# Patient Record
Sex: Male | Born: 1995
Health system: Southern US, Community
[De-identification: ages and names within clinical notes are randomized; demographics above are authoritative.]

## PROBLEM LIST (undated history)

## (undated) DIAGNOSIS — K900 Celiac disease: Secondary | ICD-10-CM

## (undated) DIAGNOSIS — I77810 Thoracic aortic ectasia: Secondary | ICD-10-CM

## (undated) HISTORY — DX: Thoracic aortic ectasia: I77.810

## (undated) HISTORY — PX: TONSILLECTOMY: SHX5217

## (undated) HISTORY — DX: Celiac disease: K90.0

---
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
## 2015-06-25 DIAGNOSIS — K900 Celiac disease: Secondary | ICD-10-CM | POA: Insufficient documentation

## 2015-07-01 ENCOUNTER — Encounter: Payer: Self-pay | Admitting: Family Medicine

## 2015-07-01 ENCOUNTER — Ambulatory Visit (INDEPENDENT_AMBULATORY_CARE_PROVIDER_SITE_OTHER): Payer: BLUE CROSS/BLUE SHIELD | Admitting: Family Medicine

## 2015-07-01 VITALS — BP 118/77 | HR 75 | Temp 97.8°F | Ht 73.0 in | Wt 206.0 lb

## 2015-07-01 DIAGNOSIS — Z8249 Family history of ischemic heart disease and other diseases of the circulatory system: Secondary | ICD-10-CM | POA: Diagnosis not present

## 2015-07-01 DIAGNOSIS — Z0000 Encounter for general adult medical examination without abnormal findings: Secondary | ICD-10-CM | POA: Diagnosis not present

## 2015-07-01 NOTE — Patient Instructions (Addendum)
Limit alcohol to 14 drinks per week, no more than 4 on any one occasion I have put in an order for an echocardiogram Flu shots yearly Return at your convenience for ADHD evaluation If you have not heard anything from my staff in a week about any orders/referrals/studies from today, please contact us here to follow-up [phone]  Health Maintenance - 21-21 Years [place] After high school, you may attend college or technical or vocational school, enroll in the [employer], or enter the workforce. PHYSICAL, SOCIAL, AND EMOTIONAL DEVELOPMENT  One hour of regular physical activity daily is recommended. Continue to participate in sports.  Develop your own interests and consider community service or volunteerism.  Make decisions about college and work plans.  Throughout these years, you should assume responsibility for your own health care. Increasing independence is important for you.  You may be exploring your sexual identity. Understand that you should never be in a situation that makes you feel uncomfortable, and tell your partner if you do not want to engage in sexual activity.  Body image may become important to you. Be mindful that eating disorders can develop at this time. Talk to your parents or other caregivers if you have concerns about body image, weight gain, or losing weight.  You may notice mood disturbances, depression, anxiety, attention problems, or trouble with alcohol. Talk to your health care provider if you have concerns about mental illness.  Set limits for yourself and talk with your parents or other caregivers about independent decision making.  Handle conflict without physical violence.  Avoid loud noises which may impair hearing.  Limit television and computer time to 2 hours each day. Individuals who engage in excessive inactivity are more likely to become overweight. RECOMMENDED IMMUNIZATIONS  Influenza vaccine.  All adults should be immunized  every year.  All adults, including pregnant women and people with hives-only allergy to eggs, can receive the inactivated influenza (IIV) vaccine.  Adults aged 18-49 years can receive the recombinant influenza (RIV) vaccine. The RIV vaccine does not contain any egg protein.  Tetanus, diphtheria, and acellular pertussis (Td, Tdap) vaccine.  Pregnant women should receive 1 dose of Tdap vaccine during each pregnancy. The dose should be obtained regardless of the length of time since the last dose. Immunization is preferred during the 27th to 36th week of gestation.  An adult who has not previously received Tdap or who does not know his or her vaccine status should receive 1 dose of Tdap. This initial dose should be followed by tetanus and diphtheria toxoids (Td) booster doses every 10 years.  Adults with an unknown or incomplete history of completing a 3-dose immunization series with Td-containing vaccines should begin or complete a primary immunization series including a Tdap dose.  Adults should receive a Td booster every 10 years.  Varicella vaccine.  An adult without evidence of immunity to varicella should receive 2 doses or a second dose if he or she has previously received 1 dose.  Pregnant females who do not have evidence of immunity should receive the first dose after pregnancy. This first dose should be obtained before leaving the health care facility. The second dose should be obtained 4-8 weeks after the first dose.  Human papillomavirus (HPV) vaccine.  Females aged 13-26 years who have not received the vaccine previously should obtain the 3-dose series.  The vaccine is not recommended for pregnant females. However, pregnancy testing is not needed before receiving a dose. If a male is found  to be pregnant after receiving a dose, no treatment is needed. In that case, the remaining doses should be delayed until after the pregnancy.  Males aged 57-21 years who have not received  the vaccine previously should receive the 3-dose series. Males aged 22-26 years may be immunized.  Immunization is recommended through the age of 72 years for any male who has sex with males and did not get any or all doses earlier.  Immunization is recommended for any person with an immunocompromised condition through the age of 27 years if he or she did not get any or all doses earlier.  During the 3-dose series, the second dose should be obtained 4-8 weeks after the first dose. The third dose should be obtained 24 weeks after the first dose and 16 weeks after the second dose.  Measles, mumps, and rubella (MMR) vaccine.  Adults born in [birth year] or later should have 1 or more doses of MMR vaccine unless there is a contraindication to the vaccine or there is laboratory evidence of immunity to each of the three diseases.  A routine second dose of MMR vaccine should be obtained at least 28 days after the first dose for students attending postsecondary schools, health care workers, and international travelers.  For females of childbearing age, rubella immunity should be determined. If there is no evidence of immunity, females who are not pregnant should be vaccinated. If there is no evidence of immunity, females who are pregnant should delay immunization until after pregnancy.  Pneumococcal 13-valent conjugate (PCV13) vaccine.  When indicated, a person who is uncertain of his or her immunization history and has no record of immunization should receive the PCV13 vaccine.  An adult aged 72 years or older who has certain medical conditions and has not been previously immunized should receive 1 dose of PCV13 vaccine. This PCV13 should be followed with a dose of pneumococcal polysaccharide (PPSV23) vaccine. The PPSV23 vaccine dose should be obtained at least 8 weeks after the dose of PCV13 vaccine.  An adult aged 63 years or older who has certain medical conditions and previously received 1 or more doses  of PPSV23 vaccine should receive 1 dose of PCV13. The PCV13 vaccine dose should be obtained 1 or more years after the last PPSV23 vaccine dose.  Pneumococcal polysaccharide (PPSV23) vaccine.  When PCV13 is also indicated, PCV13 should be obtained first.  An adult younger than age 66 years who has certain medical conditions should be immunized.  Any person who resides in a long-term care facility should be immunized.  An adult smoker should be immunized.  People with an immunocompromised condition and certain other conditions should receive both PCV13 and PPSV23 vaccines.  People with human immunodeficiency virus (HIV) infection should be immunized as soon as possible after diagnosis.  Immunization during chemotherapy or radiation therapy should be avoided.  Routine use of PPSV23 vaccine is not recommended for American Indians, [place] Natives, or people younger than 65 years unless there are medical conditions that require PPSV23 vaccine.  When indicated, people who have unknown immunization and have no record of immunization should receive PPSV23 vaccine.  One-time revaccination 5 years after the first dose of PPSV23 is recommended for people aged 19-64 years who have chronic kidney failure, nephrotic syndrome, asplenia, or immunocompromised conditions.  Meningococcal vaccine.  Adults with asplenia or persistent complement component deficiencies should receive 2 doses of quadrivalent meningococcal conjugate (MenACWY-D) vaccine. The doses should be obtained at least 2 months apart.  Microbiologists working with  certain meningococcal bacteria, [employer], people at risk during an outbreak, and people who travel to or live in countries with a high rate of meningitis should be immunized.  A first-year college student up through age 26 years who is living in a residence hall should receive a dose if he or she did not receive a dose on or after his or her 16th birthday.  Adults who  have certain high-risk conditions should receive one or more doses of vaccine.  Hepatitis A vaccine.  Adults who wish to be protected from this disease, have certain high-risk conditions, work with hepatitis A-infected animals, work in hepatitis A research labs, or travel to or work in countries with a high rate of hepatitis A should be immunized.  Adults who were previously unvaccinated and who anticipate close contact with an international adoptee during the first 60 days after arrival in the [place] from a country with a high rate of hepatitis A should be immunized.  Hepatitis B vaccine.  Adults who wish to be protected from this disease, have certain high-risk conditions, may be exposed to blood or other infectious body fluids, are household contacts or sex partners of hepatitis B positive people, are clients or workers in certain care facilities, or travel to or work in countries with a high rate of hepatitis B should be immunized.  Haemophilus influenzae type b (Hib) vaccine.  A previously unvaccinated person with asplenia or sickle cell disease or having a scheduled splenectomy should receive 1 dose of Hib vaccine.  Regardless of previous immunization, a recipient of a hematopoietic stem cell transplant should receive a 3-dose series 6-12 months after his or her successful transplant.  Hib vaccine is not recommended for adults with HIV infection. TESTING  Annual screening for vision and hearing problems is recommended. Vision should be screened at least once between 80-22 years of age.  You may be screened for anemia or tuberculosis.  You should have a blood test to check for high cholesterol.  You should be screened for alcohol and drug use.  If you are sexually active, you may be screened for sexually transmitted infections (STIs), pregnancy, or HIV. You should be screened for STIs if:  Your sexual activity has changed since the last screening test, and you are at an  increased risk for chlamydia or gonorrhea. Ask your health care provider if you are at risk.  If you are at an increased risk for hepatitis B, you should be screened for this virus. You are considered at high risk for hepatitis B if you:  Were born in a country where hepatitis B occurs often. Talk with your health care provider about which countries are considered high risk.  Have parents who were born in a high-risk country and have not received a shot to protect against hepatitis B (hepatitis B vaccine).  Have HIV or AIDS.  Use needles to inject street drugs.  Live with or have sex with someone who has hepatitis B.  Are a man who has sex with other men (MSM).  Get hemodialysis treatment.  Take certain medicines for conditions like cancer, organ transplantation, or autoimmune conditions. NUTRITION   You should:  Have three servings of low-fat milk and dairy products daily. If you do not drink milk or consume dairy products, you should eat calcium-enriched foods, such as juice, bread, or cereal. Dark, leafy greens or canned fish are alternate sources of calcium.  Drink plenty of water. Fruit juice should be limited to 8-12  oz (240-360 mL) each day. Sugary beverages and sodas should be avoided.  Avoid eating foods high in fat, salt, or sugar, such as chips, candy, and cookies.  Avoid fast foods and limit eating out at restaurants.  Try not to skip meals, especially breakfast. You should eat a variety of vegetables, fruits, and lean meats.  Eat meals together as a family whenever possible. ORAL HEALTH Brush your teeth twice a day and floss at least once a day. You should have two dental exams a year.  SKIN CARE You should wear sunscreen when out in the sun. TALK TO SOMEONE ABOUT:  Precautions against pregnancy, contraception, and sexually transmitted infections.  Taking a prescription medicine daily to prevent HIV infection if you are at risk of being infected with HIV. This  is called preexposure prophylaxis (PrEP). You are at risk if you:  Are a male who has sex with other males (MSM).  Are heterosexual and sexually active with more than one partner.  Take drugs by injection.  Are sexually active with a partner who has HIV.  Whether you are at high risk of being infected with HIV. If you choose to begin PrEP, you should first be tested for HIV. You should then be tested every 3 months for as long as you are taking PrEP.  Drug, tobacco, and alcohol use among your friends or at friends' homes. Smoking tobacco or marijuana and taking drugs have health consequences and may impact your brain development.  Appropriate use of over-the-counter or prescription medicines.  Driving guidelines and riding with friends.  The risks of drinking and driving or boating. Call someone if you have been drinking or using drugs and need a ride. WHAT'S NEXT? Visit your pediatrician or family physician once a year. By young adulthood, you should transition from your pediatrician to a family physician or internal medicine specialist. If you are a male and are sexually active, you may want to begin annual physical exams with a gynecologist. Document Released: 01/05/2007 Document Revised: 10/15/2013 Document Reviewed: 01/25/2007 [facility] Patient Information 2015 [place], [place]. This information is not intended to replace advice given to you by your health care provider. Make sure you discuss any questions you have with your health care provider. Celiac Disease Celiac disease is a digestive disease that causes your body's natural defense system (immune system) to react against its own cells. It interferes with taking in (absorbing) nutrients from food. Celiac disease is also known as celiac sprue, nontropical sprue, and gluten-sensitive enteropathy. People who have celiac disease cannot tolerate gluten. Gluten is a protein found in wheat, rye, and barley. With time, celiac disease will  damage the cells lining the small intestine. This leads to being unable to absorb nutrients from food (malabsorption), diarrhea, and nutritional problems. CAUSES  Celiac disease is genetic. This means you have a higher likelihood of getting the disease if someone in your family has or has had it. Up to 10% of your close relatives (parent, sibling, child) may also have the disease.  People with celiac disease tend to have other autoimmune diseases. The link may be genetic. These diseases include:  Dermatitis herpetiformis.  Thyroid disease.  Systemic lupus erythematosus.  Type 1 diabetes.  Liver disease.  Collagen vascular disease.  Rheumatoid arthritis.  Sjogren syndrome. SYMPTOMS  The symptoms of celiac disease vary from person to person. The symptoms are generally digestive or nutritional. Digestive symptoms include:  Recurring belly (abdominal) bloating and pain.  Gas.  Long-term (chronic) diarrhea.  Pale,  bad-smelling, greasy, or oily stool. Nutritional symptoms include:  Failure to thrive in infants.  Delayed growth in children.  Weight loss in children and adults.  Missed menstrual periods (often due to extreme weight loss).  Anemia.  Weakening bones (osteoporosis).  Fatigue and weakness.  Tingling or other signs of nerve damage (peripheral neuropathy).  Depression. DIAGNOSIS  If your symptoms and physical exam suggest that a digestive disorder or malnutrition is present, your caregiver may suspect celiac disease. You may have already begun a gluten-free diet. If symptoms persist, testing may be needed to confirm the diagnosis. Some tests are best done while you are on a normal, unrestricted diet. Tests may include:  Blood tests to check for nutritional deficiencies.  Blood tests to look for evidence that the body is producing antibodies against its own small intestine cells.  Taking a tissue sample (biopsy) from the small bowel for evaluation.  X-rays  of the small bowel.  Evaluating the stool for fat.  Tests to check for nutrient absorption from the intestine. TREATMENT  It is important to seek treatment. Untreated celiac disease can cause growth problems (in children), anemia, osteoporosis, and possible nerve problems. A pregnant patient with untreated celiac disease has a higher risk of miscarriage, and the fetus has an increased risk of low birth weight and other growth problems. If celiac disease is diagnosed in the early stages, treatment can allow you to live a long, nearly symptom-free life. Treatment includes following a gluten-free diet. This means avoiding all foods that contain gluten. Eating even a small amount of gluten can damage your intestine. For most people, following this diet will stop symptoms. It will heal existing intestinal damage and prevent further damage. Improvements begin within days of starting the diet. The small intestine is usually completely healed within 3 to 6 months, or it may take up to 2 years for older adults. A small percentage of people do not improve on the gluten-free diet. Depending on your age and the stage at which you were diagnosed, some problems such as delayed growth and discolored teeth may not improve. Sometimes, damaged intestines cannot heal. If your intestines are not absorbing enough nutrients, you may need to receive nutrition supplements through an intravenous (IV) tube. Drug treatments are being tested for unresponsive celiac disease. In this case, you may need to be evaluated for complications of the disease. Your caregiver may also recommend:  A pneumonia vaccination.  Nutrients and other treatments for any nutritional deficiencies. Your caregiver can provide you with more information on a gluten-free diet. Discussion with a dietitian skilled in this illness will be valuable. Support groups may also be helpful. HOME CARE INSTRUCTIONS   Focus on a gluten-free diet. This diet must become  a way of life.  Monitor your response to the gluten-free diet and treat any nutritional deficiencies.  Prepare ahead of time if you decide to eat outside the home.  Make and keep your regular follow-up visits with your caregiver.  Suggest to family members that they get screened for early signs of the disease. SEEK MEDICAL CARE IF:   You continue to have digestive symptoms (gas, cramping, diarrhea) despite a proper diet.  You have trouble sticking to the gluten-free diet.  You develop an itchy rash with groups of tiny blisters.  You develop severe weakness, balance problems, menstrual problems, or depression. Document Released: 10/10/2005 Document Revised: 02/24/2014 Document Reviewed: 01/27/2010 [facility] Patient Information 2015 [place], [place]. This information is not intended to replace advice given  to you by your health care provider. Make sure you discuss any questions you have with your health care provider.  

## 2015-07-01 NOTE — Assessment & Plan Note (Signed)
Encouraged healthy living; STD testing offered, declined by patient; recommended monthly self-testicular exams; flu shots recommended yearly; offered cholesterol and glucose testing, patient declined blood work today; see AVS for more information and guidance

## 2015-07-01 NOTE — Progress Notes (Signed)
Patient ID: Gavin Johnson, male   DOB: 08/07/1996, 19 y.o.   MRN: [id number]  Subjective:   Gavin Johnson is a 19 y.o. male here for a complete physical exam  Interim issues since last visit: father had aortic aneurysm repair; grandfather is still living, has aortic aneurysm Patient has had bronchitis four times and pneumonia one time; shots are UTD No chest pain with exercise; occasional shortness of breath with running, one out of every ten times has to stop exercising; goes out for a jog and more difficult to breathe than usual Little bit of swelling in the lower legs after running; goes away; plays soccer  Tobacco: quit after 3-4 months, then dipping, chewing tobacco; still uses occasionally Alcohol: more than guidelines Depression screen [name] 2/9 07/01/2015  Decreased Interest 0  Down, Depressed, Hopeless 0  PHQ - 2 Score 0  HTN: normal pressure today Obesity: n/a STDs: declined testing Colorectal cancer: grandfather died at age 81 from colon cancer; no blood in the stool Breast cancer: n/a Skin cancer: sunscreen occasionally  Past Medical History  Diagnosis Date  . Celiac disease    Past Surgical History  Procedure Laterality Date  . Tonsillectomy     Family History  Problem Relation Age of Onset  . Arthritis Mother   . Aortic aneurysm Father   . Cancer Maternal Grandfather     colon  . Diabetes Neg Hx    Social History  Substance Use Topics  . Smoking status: Never Smoker   . Smokeless tobacco: Current User    Types: Chew  . Alcohol Use: Yes     Comment: socially   Review of Systems  Constitutional: Negative for fever.  HENT: Negative for hearing loss.        Occasional ear infections  Eyes: Negative for visual disturbance.  Respiratory: Positive for shortness of breath (with exertion occasionally).   Cardiovascular: Negative for chest pain.  Gastrointestinal: Negative for abdominal pain.  Genitourinary: Negative for discharge.   Psychiatric/Behavioral: Negative for dysphoric mood.   Objective:   Filed Vitals:   07/01/15 1009  BP: 118/77  Pulse: 75  Temp: 97.8 F (36.6 C)  Height: 6\' 1"  (1.854 m)  Weight: 206 lb (93.441 kg)  SpO2: 96%   Body mass index is 27.18 kg/(m^2). Wt Readings from Last 3 Encounters:  07/01/15 206 lb (93.441 kg)  09/23/14 184 lb (83.462 kg)   Physical Exam  Constitutional: He appears well-developed and well-nourished. No distress.  HENT:  Head: Normocephalic and atraumatic.  Nose: Nose normal.  Mouth/Throat: Oropharynx is clear and moist.  Eyes: EOM are normal. No scleral icterus.  Neck: No JVD present. No thyromegaly present.  Cardiovascular: Normal rate, regular rhythm and normal heart sounds.   Pulmonary/Chest: Effort normal and breath sounds normal. No respiratory distress. He has no wheezes. He has no rales.  Abdominal: Soft. Bowel sounds are normal. He exhibits no distension. There is no tenderness. There is no guarding.  Musculoskeletal: Normal range of motion. He exhibits no edema.  Lymphadenopathy:    He has no cervical adenopathy.  Neurological: He is alert. He displays normal reflexes. He exhibits normal muscle tone. Coordination normal.  Skin: Skin is warm and dry. No rash noted. He is not diaphoretic. No erythema. No pallor.  Psychiatric: He has a normal mood and affect. His behavior is normal. Judgment and thought content normal.    Assessment/Plan:   Problem List Items Addressed This Visit      Other  Family hx of aortic aneurysm    With occasional DOE; order echocardiogram      Relevant Orders   Echocardiogram   Preventative health care - Primary    Encouraged healthy living; STD testing offered, declined by patient; recommended monthly self-testicular exams; flu shots recommended yearly; offered cholesterol and glucose testing, patient declined blood work today; see AVS for more information and guidance         Follow up plan: Return in about 1  year (around 06/30/2016) for next physical; return for ADHD evaluation when convenient.  An After Visit Summary was printed and given to the patient.

## 2015-07-01 NOTE — Assessment & Plan Note (Signed)
With occasional DOE; order echocardiogram

## 2015-09-09 ENCOUNTER — Ambulatory Visit
Admission: RE | Admit: 2015-09-09 | Discharge: 2015-09-09 | Disposition: A | Discharge status: Home / Self Care | Payer: BLUE CROSS/BLUE SHIELD | Source: Ambulatory Visit | Attending: Family Medicine | Admitting: Family Medicine

## 2015-09-09 DIAGNOSIS — Z8249 Family history of ischemic heart disease and other diseases of the circulatory system: Secondary | ICD-10-CM | POA: Diagnosis not present

## 2015-09-09 NOTE — Progress Notes (Signed)
*  PRELIMINARY RESULTS* Echocardiogram 2D Echocardiogram has been performed.  Gavin HousekeeperJerry R Johnson 09/09/2015, 10:38 AM

## 2016-02-20 ENCOUNTER — Ambulatory Visit
Admission: EM | Admit: 2016-02-20 | Discharge: 2016-02-20 | Disposition: A | Discharge status: Home / Self Care | Payer: BLUE CROSS/BLUE SHIELD | Attending: Family Medicine | Admitting: Family Medicine

## 2016-02-20 ENCOUNTER — Encounter: Payer: Self-pay | Admitting: Gynecology

## 2016-02-20 ENCOUNTER — Ambulatory Visit (INDEPENDENT_AMBULATORY_CARE_PROVIDER_SITE_OTHER): Payer: BLUE CROSS/BLUE SHIELD

## 2016-02-20 DIAGNOSIS — S6992XA Unspecified injury of left wrist, hand and finger(s), initial encounter: Secondary | ICD-10-CM

## 2016-02-20 DIAGNOSIS — S67193A Crushing injury of left middle finger, initial encounter: Secondary | ICD-10-CM | POA: Diagnosis not present

## 2016-02-20 MED ORDER — TETANUS-DIPHTH-ACELL PERTUSSIS 5-2.5-18.5 LF-MCG/0.5 IM SUSP
0.5000 mL | Freq: Once | INTRAMUSCULAR | Status: AC
  Administered 2016-02-20: 0.5 mL via INTRAMUSCULAR

## 2016-02-20 MED ORDER — SULFAMETHOXAZOLE-TRIMETHOPRIM 800-160 MG PO TABS: 1.0000 | ORAL_TABLET | Freq: Two times a day (BID) | ORAL | Status: DC

## 2016-02-20 NOTE — ED Notes (Signed)
Per patient moving furniture off truck x today when is left index finger caught between the door of the truck and the appliance.

## 2016-02-20 NOTE — ED Notes (Signed)
Bacitracin to tip of finger, bandaid applied, wrapped in gauze for padding and place in metal finger splint

## 2016-02-20 NOTE — ED Provider Notes (Signed)
CSN: [id number]     Arrival date & time 02/20/16  1344 History   First MD Initiated Contact with Patient 02/20/16 1529     Chief Complaint  Patient presents with  . Finger Injury   (Consider location/radiation/quality/duration/timing/severity/associated sxs/prior Treatment) HPI Comments: [age] yo male with a c/o left index finger injury while moving furniture today. States finger got caught between the door of truck and appliance.   The history is provided by the patient.    Past Medical History  Diagnosis Date  . Celiac disease    Past Surgical History  Procedure Laterality Date  . Tonsillectomy     Family History  Problem Relation Age of Onset  . Arthritis Mother   . Aortic aneurysm Father   . Cancer Maternal Grandfather     colon  . Diabetes Neg Hx    Social History  Substance Use Topics  . Smoking status: Never Smoker   . Smokeless tobacco: Current User    Types: Chew  . Alcohol Use: Yes     Comment: socially    Review of Systems  Allergies  Amoxil  Home Medications   Prior to Admission medications   Medication Sig Start Date End Date Taking? Authorizing Provider  sulfamethoxazole-trimethoprim (BACTRIM DS,SEPTRA DS) 800-160 MG tablet Take 1 tablet by mouth 2 (two) times daily. 02/20/16   Payton Mccallumrlando Roy Snuffer, MD   Meds Ordered and Administered this Visit   Medications  Tdap (BOOSTRIX) injection 0.5 mL (0.5 mLs Intramuscular Given 02/20/16 1443)    BP 119/69 mmHg  Pulse 69  Temp(Src) 98.6 F (37 C) (Oral)  Ht 6\' 2"  (1.88 m)  Wt 210 lb (95.255 kg)  BMI 26.95 kg/m2  SpO2 99% No data found.   Physical Exam  Constitutional: He appears well-developed and well-nourished. No distress.  Musculoskeletal:       Left hand: He exhibits tenderness (mild tenderness to palpation over the distal DIP left middle finger) and swelling (mild). He exhibits normal range of motion, no bony tenderness, normal two-point discrimination, normal capillary refill and no laceration  (very superficial mild laceration just underneath the nail of the left middle finger). Normal sensation noted. Normal strength noted.       Hands: Skin: He is not diaphoretic.  Nursing note and vitals reviewed.   ED Course  Procedures (including critical care time)  Labs Review Labs Reviewed - No data to display  Imaging Review No results found.   Visual Acuity Review  Right Eye Distance:   Left Eye Distance:   Bilateral Distance:    Right Eye Near:   Left Eye Near:    Bilateral Near:         MDM   1. Injury of left middle finger, initial encounter      1. x-ray results and diagnosis reviewed with patient; wound irrigated and cleaned; Tetanus vaccine given in clinic 2. rx as per orders above; reviewed possible side effects, interactions, risks and benefits  3. Recommend supportive treatment with otc analgesics prn 4. Monitor and follow-up prn if symptoms worsen or don't improve    Payton Mccallumrlando Lakisha Peyser, MD 02/29/16 2214

## 2016-04-12 DIAGNOSIS — H10021 Other mucopurulent conjunctivitis, right eye: Secondary | ICD-10-CM | POA: Diagnosis not present

## 2016-07-08 ENCOUNTER — Encounter: Payer: Self-pay | Admitting: Family Medicine

## 2016-07-08 ENCOUNTER — Ambulatory Visit (INDEPENDENT_AMBULATORY_CARE_PROVIDER_SITE_OTHER): Payer: BLUE CROSS/BLUE SHIELD | Admitting: Family Medicine

## 2016-07-08 ENCOUNTER — Telehealth: Payer: Self-pay

## 2016-07-08 VITALS — BP 120/80 | HR 88 | Temp 98.2°F | Wt 208.0 lb

## 2016-07-08 DIAGNOSIS — J069 Acute upper respiratory infection, unspecified: Secondary | ICD-10-CM

## 2016-07-08 DIAGNOSIS — J9801 Acute bronchospasm: Secondary | ICD-10-CM | POA: Diagnosis not present

## 2016-07-08 MED ORDER — ALBUTEROL SULFATE HFA 108 (90 BASE) MCG/ACT IN AERS: 2.0000 | INHALATION_SPRAY | RESPIRATORY_TRACT | 1 refills | Status: DC | PRN

## 2016-07-08 MED ORDER — DOXYCYCLINE HYCLATE 100 MG PO TABS: 100.0000 mg | ORAL_TABLET | Freq: Two times a day (BID) | ORAL | 0 refills | Status: AC

## 2016-07-08 MED ORDER — PREDNISONE 10 MG PO TABS: ORAL_TABLET | ORAL | 0 refills | Status: AC

## 2016-07-08 NOTE — Telephone Encounter (Signed)
Insurance Prefers [employer]. Please send New RX for [employer].

## 2016-07-08 NOTE — Patient Instructions (Addendum)
Out of work today, Sat, Sunday Pick up medicines Only take the antibiotics if absolutely needed Please do eat yogurt daily or take a probiotic daily for the next month or two We want to replace the healthy germs in the gut If you notice foul, watery diarrhea in the next two months, schedule an appointment RIGHT AWAY Try vitamin C (orange juice if not diabetic or vitamin C tablets) and drink green tea to help your immune system during your illness Get plenty of rest and hydration

## 2016-07-08 NOTE — Progress Notes (Signed)
BP 120/80   Pulse 88   Temp 98.2 F (36.8 C) (Oral)   Wt 208 lb (94.3 kg)   SpO2 96%   BMI 26.71 kg/m    Subjective:    Patient ID: Gavin Johnson, male    DOB: 04-02-1996, 20 y.o.   MRN: [id number]  HPI: Gavin Johnson is a 20 y.o. male  Chief Complaint  Patient presents with  . URI    cough, congestion, poss fever & runny nose   Cough, congestion, possible fever, runny nose; woke up last Thursday with terrible body aches; cough followed a few days later; ears feel like they are in a tunnel; right ear worse; not much in the nose, drainage down the throat; can breathe out of the nose; cheeks feel a little swollen; terrible cough; hx of bronchitis; has had to use an inhaler; asthma as a child He has been trying Dayquil; no rash; no travel Roommate has been sick, similar symptoms Did have some body aches Had flu shot six months Works lifting heavy things all day  [facility] 07/08/2016 07/01/2015  Decreased Interest 0 0  Down, Depressed, Hopeless 0 0  PHQ - 2 Score 0 0   Relevant past medical, surgical, family and social history reviewed Past Medical History:  Diagnosis Date  . Celiac disease   MD note: hx of asthma as a child  Past Surgical History:  Procedure Laterality Date  . TONSILLECTOMY     Social History  Substance Use Topics  . Smoking status: Never Smoker  . Smokeless tobacco: Current User    Types: Chew  . Alcohol use Yes     Comment: socially   Interim medical history since last visit reviewed. Allergies and medications reviewed  Review of Systems Per HPI unless specifically indicated above     Objective:    BP 120/80   Pulse 88   Temp 98.2 F (36.8 C) (Oral)   Wt 208 lb (94.3 kg)   SpO2 96%   BMI 26.71 kg/m   Wt Readings from Last 3 Encounters:  07/08/16 208 lb (94.3 kg)  02/20/16 210 lb (95.3 kg)  07/01/15 206 lb (93.4 kg)    Physical Exam  Constitutional: He appears well-developed and well-nourished. No distress.    HENT:  Head: Normocephalic and atraumatic.  Right Ear: External ear normal.  Left Ear: External ear normal.  Nose: Nose normal.  Mouth/Throat: Oropharynx is clear and moist. No oropharyngeal exudate.  Eyes: EOM are normal. Right eye exhibits no discharge. Left eye exhibits no discharge.  Cardiovascular: Normal rate and regular rhythm.   No extrasystoles are present.  Pulmonary/Chest: Effort normal and breath sounds normal. No respiratory distress. He has no decreased breath sounds. He has no wheezes. He has no rhonchi. He has no rales.  Listening over mainstem bronchus, some tightness with forced cough  Lymphadenopathy:    He has no cervical adenopathy.  Neurological: He is alert. He displays no tremor.  Skin: No rash noted. He is not diaphoretic.  Psychiatric: He has a normal mood and affect. His speech is normal and behavior is normal. Judgment and thought content normal. Cognition and memory are normal.    No results found for this or any previous visit.    Assessment & Plan:   Problem List Items Addressed This Visit    None    Visit Diagnoses    Upper respiratory infection    -  Primary   most likely viral process;  explained antibiotic Rx to fill over weekend ONLY if absolutely needed for secondary bact infection; risk of C diff discussed   Bronchospasm, acute       will use low dose prednisone and SABA PRN       Follow up plan: No Follow-up on file.  An after-visit summary was printed and given to the patient at check-out.  Please see the patient instructions which may contain other information and recommendations beyond what is mentioned above in the assessment and plan.  Meds ordered this encounter  Medications  . albuterol (PROVENTIL HFA;VENTOLIN HFA) 108 (90 Base) MCG/ACT inhaler    Sig: Inhale 2 puffs into the lungs every 4 (four) hours as needed for wheezing or shortness of breath.    Dispense:  1 Inhaler    Refill:  1  . predniSONE (DELTASONE) 10 MG tablet     Sig: Take 3 pills daily on Fri and Sat, then 2 pills daily on Sun and Mon, then 1 pill daily on Tues and Wed    Dispense:  12 tablet    Refill:  0  . doxycycline (VIBRA-TABS) 100 MG tablet    Sig: Take 1 tablet (100 mg total) by mouth 2 (two) times daily.    Dispense:  20 tablet    Refill:  0

## 2017-11-17 ENCOUNTER — Ambulatory Visit
Admission: RE | Admit: 2017-11-17 | Discharge: 2017-11-17 | Disposition: A | Discharge status: Home / Self Care | Payer: BLUE CROSS/BLUE SHIELD | Source: Ambulatory Visit | Attending: Family Medicine | Admitting: Family Medicine

## 2017-11-17 ENCOUNTER — Other Ambulatory Visit: Payer: Self-pay | Admitting: Family Medicine

## 2017-11-17 ENCOUNTER — Ambulatory Visit: Payer: BLUE CROSS/BLUE SHIELD | Admitting: Family Medicine

## 2017-11-17 ENCOUNTER — Encounter: Payer: Self-pay | Admitting: Family Medicine

## 2017-11-17 VITALS — BP 110/70 | HR 86 | Temp 98.2°F | Resp 18 | Ht 73.0 in | Wt 218.6 lb

## 2017-11-17 DIAGNOSIS — M4184 Other forms of scoliosis, thoracic region: Secondary | ICD-10-CM | POA: Insufficient documentation

## 2017-11-17 DIAGNOSIS — R05 Cough: Secondary | ICD-10-CM

## 2017-11-17 DIAGNOSIS — J189 Pneumonia, unspecified organism: Secondary | ICD-10-CM

## 2017-11-17 DIAGNOSIS — J9811 Atelectasis: Secondary | ICD-10-CM | POA: Insufficient documentation

## 2017-11-17 DIAGNOSIS — R059 Cough, unspecified: Secondary | ICD-10-CM

## 2017-11-17 DIAGNOSIS — J309 Allergic rhinitis, unspecified: Secondary | ICD-10-CM

## 2017-11-17 DIAGNOSIS — R0602 Shortness of breath: Secondary | ICD-10-CM

## 2017-11-17 MED ORDER — LORATADINE 10 MG PO TABS
10.0000 mg | ORAL_TABLET | Freq: Every day | ORAL | 11 refills | Status: DC
Start: 2017-11-17 — End: 2019-02-21

## 2017-11-17 MED ORDER — PROMETHAZINE-DM 6.25-15 MG/5ML PO SYRP: 5.0000 mL | ORAL_SOLUTION | Freq: Four times a day (QID) | ORAL | 0 refills | Status: DC | PRN

## 2017-11-17 MED ORDER — FLUTICASONE PROPIONATE 50 MCG/ACT NA SUSP: 2.0000 | Freq: Every day | NASAL | 6 refills | Status: DC

## 2017-11-17 MED ORDER — ALBUTEROL SULFATE HFA 108 (90 BASE) MCG/ACT IN AERS: 2.0000 | INHALATION_SPRAY | RESPIRATORY_TRACT | 1 refills | Status: DC | PRN

## 2017-11-17 MED ORDER — AZITHROMYCIN 250 MG PO TABS: ORAL_TABLET | ORAL | 0 refills | Status: DC

## 2017-11-17 NOTE — Patient Instructions (Signed)
Cool Mist Vaporizer A cool mist vaporizer is a device that releases a cool mist into the air. If you have a cough or a cold, using a vaporizer may help relieve your symptoms. The mist adds moisture to the air, which may help thin your mucus and make it less sticky. When your mucus is thin and less sticky, it easier for you to breathe and to cough up secretions. Do not use a vaporizer if you are allergic to mold. Follow these instructions at home:  Follow the instructions that come with the vaporizer.  Do not use anything other than distilled water in the vaporizer.  Do not run the vaporizer all of the time. Doing that can cause mold or bacteria to grow in the vaporizer.  Clean the vaporizer after each time that you use it.  Clean and dry the vaporizer well before storing it.  Stop using the vaporizer if your breathing symptoms get worse. This information is not intended to replace advice given to you by your health care provider. Make sure you discuss any questions you have with your health care provider. Document Released: 07/07/2004 Document Revised: 04/29/2016 Document Reviewed: 01/09/2016 Elsevier Interactive Patient Education  2018 Else[employer] Inc[employer]   Upper Respiratory Infection, Adult Most upper respiratory infections (URIs) are caused by a virus. A URI affects the nose, throat, and upper air passages. The most common type of URI is often called "the common cold." Follow these instructions at home:  Take medicines only as told by your doctor.  Gargle warm saltwater or take cough drops to comfort your throat as told by your doctor.  Use a warm mist humidifier or inhale steam from a shower to increase air moisture. This may make it easier to breathe.  Drink enough fluid to keep your pee (urine) clear or pale yellow.  Eat soups and other clear broths.  Have a healthy diet.  Rest as needed.  Go back to work when your fever is gone or your doctor says it is okay. ? You may need  to stay home longer to avoid giving your URI to others. ? You can also wear a face mask and wash your hands often to prevent spread of the virus.  Use your inhaler more if you have asthma.  Do not use any tobacco products, including cigarettes, chewing tobacco, or electronic cigarettes. If you need help quitting, ask your doctor. Contact a doctor if:  You are getting worse, not better.  Your symptoms are not helped by medicine.  You have chills.  You are getting more short of breath.  You have brown or red mucus.  You have yellow or brown discharge from your nose.  You have pain in your face, especially when you bend forward.  You have a fever.  You have puffy (swollen) neck glands.  You have pain while swallowing.  You have white areas in the back of your throat. Get help right away if:  You have very bad or constant: ? Headache. ? Ear pain. ? Pain in your forehead, behind your eyes, and over your cheekbones (sinus pain). ? Chest pain.  You have long-lasting (chronic) lung disease and any of the following: ? Wheezing. ? Long-lasting cough. ? Coughing up blood. ? A change in your usual mucus.  You have a stiff neck.  You have changes in your: ? Vision. ? Hearing. ? Thinking. ? Mood. This information is not intended to replace advice given to you by your health care provider. Make   sure you discuss any questions you have with your health care provider. Document Released: 03/28/2008 Document Revised: 06/12/2016 Document Reviewed: 01/15/2014 Elsevier Interactive Patient Education  2018 Elsevier Inc.   

## 2017-11-17 NOTE — Progress Notes (Signed)
Name: Gavin Johnson   MRN: [id number]    DOB: December 15, 1995   Date:11/17/2017       Progress Note  Subjective  Chief Complaint  Chief Complaint  Patient presents with  . Cough    post nasal drip, chest congestion, cough is non productive for 6 weeks    HPI  PT presents with concern for ongoing non-productive cough, post-nasal drip, nasal congestion, fatigue (taking naps most afternoons), some shortness of breath, some chest discomfort when coughing, headaches for about 4 weeks. Denies fevers/chills, NVD, abdominal pain, body aches, decreased appetite, rashes. Hx asthma - has been prescribed albuterol inhaler in the past, but uses very rarely - is out of this today, we will provide refill.  Patient Active Problem List   Diagnosis Date Noted  . Family hx of aortic aneurysm 07/01/2015  . Preventative health care 07/01/2015  . Celiac disease     Social History   Tobacco Use  . Smoking status: Never Smoker  . Smokeless tobacco: Current User    Types: Chew  Substance Use Topics  . Alcohol use: Yes    Comment: socially     Current Outpatient Medications:  .  albuterol (PROAIR HFA) 108 (90 Base) MCG/ACT inhaler, Inhale 2 puffs into the lungs every 4 (four) hours as needed for wheezing or shortness of breath. (Patient not taking: Reported on 11/17/2017), Disp: 1 Inhaler, Rfl: 1  Allergies  Allergen Reactions  . Acetaminophen Hives  . Amoxil [Amoxicillin] Rash    ROS Constitutional: Negative for fever or weight change.  Respiratory: See HPI Cardiovascular: Negative for chest pain or palpitations.  Gastrointestinal: Negative for abdominal pain, no bowel changes.  Musculoskeletal: Negative for gait problem or joint swelling.  Skin: Negative for rash.  Neurological: Negative for dizziness.  Positive for headaches. No other specific complaints in a complete review of systems (except as listed in HPI above).  Objective  Vitals:   11/17/17 0858  BP: 110/70  Pulse: 86  Resp:  18  Temp: 98.2 F (36.8 C)  TempSrc: Oral  SpO2: 95%  Weight: 218 lb 9.6 oz (99.2 kg)  Height: 6\' 1"  (1.854 m)   Body mass index is 28.84 kg/m.  Nursing Note and Vital Signs reviewed.  Physical Exam  Constitutional: Patient appears well-developed and well-nourished.  No distress.  HEENT: head atraumatic, normocephalic, pupils equal and reactive to light, EOM's intact, TM's without erythema or bulging, no maxillary or frontal sinus tenderness, neck supple with mild RIGHT sided submandibular lymphadenopathy, oropharynx pink and moist without exudate; cobblestoning present in OP. Cardiovascular: Normal rate, regular rhythm, S1/S2 present.  No murmur or rub heard. No BLE edema. Pulmonary/Chest: Effort normal and breath sounds clear. No respiratory distress or retractions. Psychiatric: Patient has a normal mood and affect. behavior is normal. Judgment and thought content normal.  No results found for this or any previous visit (from the past 72 hour(s)).  Assessment & Plan  1. Shortness of breath - albuterol (PROAIR HFA) 108 (90 Base) MCG/ACT inhaler; Inhale 2 puffs into the lungs every 4 (four) hours as needed for wheezing or shortness of breath.  Dispense: 1 Inhaler; Refill: 1 - DG Chest 2 View; Future - Advised if CXR is negative, we will proceed with laboratory testing.  2. Cough - promethazine-dextromethorphan (PROMETHAZINE-DM) 6.25-15 MG/5ML syrup; Take 5 mLs by mouth 4 (four) times daily as needed for cough.  Dispense: 180 mL; Refill: 0 - DG Chest 2 View; Future  3. Allergic rhinitis, unspecified seasonality, unspecified trigger -  fluticasone (FLONASE) 50 MCG/ACT nasal spray; Place 2 sprays into both nostrils daily.  Dispense: 16 g; Refill: 6 - loratadine (CLARITIN) 10 MG tablet; Take 1 tablet (10 mg total) by mouth daily.  Dispense: 30 tablet; Refill: 11  -Red flags and when to present for emergency care or RTC including fever >101.31F, chest pain, shortness of breath  unrelieved with albuterol inhaler and rest, new/worsening/un-resolving symptoms, reviewed with patient at time of visit. Follow up and care instructions discussed and provided in AVS.

## 2018-01-17 ENCOUNTER — Other Ambulatory Visit: Payer: Self-pay

## 2018-01-17 ENCOUNTER — Encounter: Payer: Self-pay | Admitting: Family Medicine

## 2018-01-17 ENCOUNTER — Telehealth: Payer: Self-pay | Admitting: Cardiovascular Disease

## 2018-01-17 ENCOUNTER — Ambulatory Visit (INDEPENDENT_AMBULATORY_CARE_PROVIDER_SITE_OTHER): Payer: BLUE CROSS/BLUE SHIELD

## 2018-01-17 ENCOUNTER — Encounter: Payer: Self-pay | Admitting: Physician Assistant

## 2018-01-17 ENCOUNTER — Telehealth: Payer: Self-pay | Admitting: Family Medicine

## 2018-01-17 ENCOUNTER — Other Ambulatory Visit: Payer: Self-pay | Admitting: Family Medicine

## 2018-01-17 ENCOUNTER — Ambulatory Visit: Payer: BLUE CROSS/BLUE SHIELD | Admitting: Physician Assistant

## 2018-01-17 VITALS — BP 124/80 | HR 67 | Ht 74.0 in | Wt 219.8 lb

## 2018-01-17 DIAGNOSIS — I77810 Thoracic aortic ectasia: Secondary | ICD-10-CM

## 2018-01-17 DIAGNOSIS — R0602 Shortness of breath: Secondary | ICD-10-CM

## 2018-01-17 DIAGNOSIS — R002 Palpitations: Secondary | ICD-10-CM

## 2018-01-17 DIAGNOSIS — R42 Dizziness and giddiness: Secondary | ICD-10-CM

## 2018-01-17 DIAGNOSIS — Z8249 Family history of ischemic heart disease and other diseases of the circulatory system: Secondary | ICD-10-CM

## 2018-01-17 HISTORY — DX: Thoracic aortic ectasia: I77.810

## 2018-01-17 NOTE — Telephone Encounter (Signed)
Patient has been scheduled today for his ECHO at 10am and a office visit with labs to follow at 11am at [facility].

## 2018-01-17 NOTE — Telephone Encounter (Signed)
Registered Zio monitor [phone]

## 2018-01-17 NOTE — Patient Instructions (Addendum)
Medication Instructions:  Your physician recommends that you continue on your current medications as directed. Please refer to the Current Medication list given to you today.   Labwork: CMET, Mag, CBC, TSH  Testing/Procedures: Your physician has recommended that you wear an event monitor. Event monitors are medical devices that record the heart's electrical activity. Doctors most often us these monitors to diagnose arrhythmias. Arrhythmias are problems with the speed or rhythm of the heartbeat. The monitor is a small, portable device. You can wear one while you do your normal daily activities. This is usually used to diagnose what is causing palpitations/syncope (passing out).  Zio monitor [id number]  Follow-Up: Your physician recommends that you schedule a follow-up appointment in: 6 weeks with Dr. Kirke CorinArida.    Any Other Special Instructions Will Be Listed Below (If Applicable).     If you need a refill on your cardiac medications before your next appointment, please call your pharmacy.

## 2018-01-17 NOTE — Assessment & Plan Note (Signed)
With palpitations and shortness of breath; refer to cardiologist ASAP; no exercise, no weight lifting until cleared by cardiologist

## 2018-01-17 NOTE — Telephone Encounter (Signed)
I talked to patient; echocardiogram just came to me from Nov 2016 They told him back in 2016 that he was at the upper limit of normal and might have an aneurysm later in life No follow-up since then I'll get him in to see someone this week He is having palpitations and some shortness of breath No exercise until seen and cleared by cardiologist No weight lifting, no basketball ------------------------------------------ Urgent referral put in Staff notified that I will speak to cardiologist to get him seen today or tomorrow if needed

## 2018-01-17 NOTE — Progress Notes (Signed)
Cardiology Office Note Date:  01/17/2018  Patient ID:  Gavin Johnson, DOB Apr 20, 1996, MRN [id number] PCP:  Kerman PasseyLada, Melinda P, MD  Card[facility]ologist[facility]  [facility]    Chief Complaint: SOB/palpitations/dizziness x 12 months  History of Present Illness: Gavin Johnson is a 22 y.o. male with history of possible mildly dilated aortic root with a mildly to moderately dilated ascending aorta and celiac disease who we were asked to urgently work in for SOB and palpitations at the request of Dr. Sherie DonLada.   Patient previously underwent echo in 08/2015 that demonstrated EF 60-65%, no RWMA, normal LV diastolic function, mildly dilated aortic root measuring 3.8 cm, mildly to moderately dilated ascending aorta estimated at 4 cm. He has not had any cardiac imaging since. There are no recent labs in Epic or Care Everywhere for review.   Patient was recently seen at his PCP's office in 10/2017 for 4-6 weeks of post nasal drip, chest congestion, and a cough. CXR showed mild left base atelectasis with mild thoracic spine scoliosis. He was treated with azithromycin, cough medication, albuterol, and Flonase. He last used the inhaler ~ 2 weeks prior. Patient's father has history of aortic aneurysm s/p surgical repair at age 24. His grandfather also had an aortic aneurysm that was monitored, though not intervened upon. Mother with arthritis. Sister in good health.   PCP called the patient this morning noting the echo she had ordered in 08/2015, as above, just showed up in her in basket prompting her to call him to check on him;. Patient reported at that time he had been noting SOB and dizziness intermittently with exertion as well as palpitations, leading to an urgent cardiology referral.   Patient comes in today noting a 12 month history of exertional SOB and dizziness when he is performing strenuous activities such as carrying cabinets up a flight of stairs. Never with any associated chest pain, diaphoresis, nausea,  vomiting, or syncope. Symptoms will improve with resting for 3-5 minutes. He has rarely noted near syncope with exertion, last occurring ~ 4 months prior. When at rest in the evenings, he will occasionally note palpitations that he can only describe as a "heart pounding" sensation. Never with associated SOB, dizziness, chest pain, diaphoresis, nausea, vomiting, presyncope, or syncope. He does not think much about these palpitations, goes about his regular activities and notes they have spontaneously resolved. He is unable to quantify these as he does not really pay atetntion. He last had an episode of palpitations 3 days prior.   He drinks 6, twelve ounce beers on a weekend. He denies ever smoking tobacco or using any illegal drugs. He drinks 60 mg of caffeine daily with [facility]. He gets 1.5 hours of strenuous activity in daily with his job. He does not go to the gym or play any sports, even for recreation. He occasionally goes fishing. No family history of sudden death. No family history of premature CAD.   Stat echo performed this morning with a preliminary review showing a stable aortic root measuring 3.8 cm, ascending aorta measuring 3.9 cm and a tricuspid aortic valve.    Past Medical History:  Diagnosis Date  . Celiac disease   . Dilated aortic root (HCC) 01/17/2018    Past Surgical History:  Procedure Laterality Date  . TONSILLECTOMY      Current Meds  Medication Sig  . albuterol (PROAIR HFA) 108 (90 Base) MCG/ACT inhaler Inhale 2 puffs into the lungs every 4 (four) hours  as needed for wheezing or shortness of breath.  . fluticasone (FLONASE) 50 MCG/ACT nasal spray Place 2 sprays into both nostrils daily. (Patient taking differently: Place 2 sprays into both nostrils daily as needed. )  . loratadine (CLARITIN) 10 MG tablet Take 1 tablet (10 mg total) by mouth daily. (Patient taking differently: Take 10 mg by mouth daily as needed. )    Allergies:   Acetaminophen and  Amoxil [amoxicillin]   Social History:  The patient  reports that he has never smoked. His smokeless tobacco use includes chew. He reports that he drinks alcohol. He reports that he does not use drugs.   Family History:  The patient's family history includes Aortic aneurysm in his father; Arthritis in his mother; Cancer in his maternal grandfather.  ROS:   Review of Systems  Constitutional: Negative for chills, diaphoresis, fever, malaise/fatigue and weight loss.  HENT: Negative for congestion.   Eyes: Negative for discharge and redness.  Respiratory: Positive for shortness of breath. Negative for cough, hemoptysis, sputum production and wheezing.   Cardiovascular: Positive for palpitations. Negative for chest pain, orthopnea, claudication, leg swelling and PND.  Gastrointestinal: Negative for abdominal pain, blood in stool, heartburn, melena, nausea and vomiting.  Genitourinary: Negative for hematuria.  Musculoskeletal: Negative for falls and myalgias.  Skin: Negative for rash.  Neurological: Positive for dizziness. Negative for tingling, tremors, sensory change, speech change, focal weakness, seizures, loss of consciousness, weakness and headaches.  Endo/Heme/Allergies: Does not bruise/bleed easily.  Psychiatric/Behavioral: Negative for substance abuse. The patient is not nervous/anxious.   All other systems reviewed and are negative.    PHYSICAL EXAM:  VS:  BP 124/80 (BP Location: Right Arm, Patient Position: Sitting, Cuff Size: Normal)   Pulse 67   Ht 6\' 2"  (1.88 m)   Wt 219 lb 12 oz (99.7 kg)   BMI 28.21 kg/m  BMI: Body mass index is 28.21 kg/m.   Right arm BP: 124/80 Left arm BP: 128/82   Physical Exam  Constitutional: He is oriented to person, place, and time. He appears well-developed and well-nourished.  HENT:  Head: Normocephalic and atraumatic.  Eyes: Right eye exhibits no discharge. Left eye exhibits no discharge.  Neck: Normal range of motion. No JVD present.    Cardiovascular: Normal rate, regular rhythm, S1 normal, S2 normal and normal heart sounds. Exam reveals no distant heart sounds, no friction rub, no midsystolic click and no opening snap.  No murmur heard. Pulses:      Posterior tibial pulses are 2+ on the right side, and 2+ on the left side.  Pulmonary/Chest: Effort normal and breath sounds normal. No respiratory distress. He has no decreased breath sounds. He has no wheezes. He has no rales. He exhibits no tenderness.  Abdominal: Soft. He exhibits no distension. There is no tenderness.  Musculoskeletal: He exhibits no edema.  Neurological: He is alert and oriented to person, place, and time.  Skin: Skin is warm and dry. No cyanosis. Nails show no clubbing.  Psychiatric: He has a normal mood and affect. His speech is normal and behavior is normal. Judgment and thought content normal.     EKG:  Was ordered and interpreted by me today. Shows NSR, 69 bpm, early repolarization, no acute st/t changes  Recent Labs: No results found for requested labs within last 8760 hours.  No results found for requested labs within last 8760 hours.   CrCl cannot be calculated (No order found.).   Wt Readings from Last 3 Encounters:  01/17/18  219 lb 12 oz (99.7 kg)  11/17/17 218 lb 9.6 oz (99.2 kg)  07/08/16 208 lb (94.3 kg)     Orthostatic Vital Signs: Lying: BP 122/76, HR 67 bpm Sitting: BP 130/82, HR 72 bpm Standing: BP 137/88, HR 73 bpm Standing x 3 minutes: BP 133/82, HR 72 bpm  Other studies reviewed: Additional studies/records reviewed today include: summarized above  ASSESSMENT AND PLAN:  1. Palpitations: Symptoms present for ~ 12 months, and improving. EKG and rhythm strip show sinus rhythm without ectopy. Check tsh, cmet, magnesium, cbc. Schedule Zio monitor. If the above cardiac workup is unrevealing, recommend he follow up with his PCP for non-cardiac etiology. Consider changing albuterol to Xopenex, defer this to his PCP as their  office has prescribed this. May want to decrease energy drink consumption.    2. Exertional dizziness and SOB: Noted for the past 12 months, intermittent and improving. Orthostatic vitals signs negative as above. Zio monitor as labs as above. Echo as below. Less likely ischemia given age and lack of risk factors.    3. Possible mildly dilated aortic root and ascending aorta: Vitals stable. This may be a normal variant for him given his height of 6'2". Echo performed today on preliminary review is essentially unchanged from the echo ordered by his PCP in 08/2015. With his father's and grandfather's history of aortic aneurysm, there certainly could be an underlying connective tissue disorder. This can be monitored with periodic echoes with Dr. Kirke Corin on an annual basis at this time. No indication for urgent intervention.   Disposition: F/u with Dr. Kirke Corin in 4-6 weeks, following Zio monitor. Follow up with PCP as directed by their office for non-cardiac issues.   Current medicines are reviewed at length with the patient today.  The patient did not have any concerns regarding medicines.  Signed, Eula Listen, PA-C 01/17/2018 11:30 AM     [facility] [place] [address] [place], [place] [address] [phone]

## 2018-01-18 LAB — CBC
Hematocrit: 48.8 % (ref 37.5–51.0)
Hemoglobin: 16.8 g/dL (ref 13.0–17.7)
MCH: 31.2 pg (ref 26.6–33.0)
MCHC: 34.4 g/dL (ref 31.5–35.7)
MCV: 91 fL (ref 79–97)
Platelets: 306 10*3/uL (ref 150–379)
RBC: 5.38 x10E6/uL (ref 4.14–5.80)
RDW: 12.8 % (ref 12.3–15.4)
WBC: 6 10*3/uL (ref 3.4–10.8)

## 2018-01-18 LAB — COMPREHENSIVE METABOLIC PANEL
ALK PHOS: 72 IU/L (ref 39–117)
ALT: 47 IU/L — ABNORMAL HIGH (ref 0–44)
AST: 32 IU/L (ref 0–40)
Albumin/Globulin Ratio: 2.1 (ref 1.2–2.2)
Albumin: 5 g/dL (ref 3.5–5.5)
BILIRUBIN TOTAL: 0.6 mg/dL (ref 0.0–1.2)
BUN / CREAT RATIO: 14 (ref 9–20)
BUN: 13 mg/dL (ref 6–20)
CHLORIDE: 99 mmol/L (ref 96–106)
CO2: 21 mmol/L (ref 20–29)
Calcium: 10.1 mg/dL (ref 8.7–10.2)
Creatinine, Ser: 0.92 mg/dL (ref 0.76–1.27)
GFR calc non Af Amer: 117 mL/min/{1.73_m2} (ref >59)
GFR, EST AFRICAN AMERICAN: 135 mL/min/{1.73_m2} (ref >59)
Globulin, Total: 2.4 g/dL (ref 1.5–4.5)
Glucose: 84 mg/dL (ref 65–99)
Potassium: 4.4 mmol/L (ref 3.5–5.2)
Sodium: 137 mmol/L (ref 134–144)
TOTAL PROTEIN: 7.4 g/dL (ref 6.0–8.5)

## 2018-01-18 LAB — TSH: TSH: 1.11 u[IU]/mL (ref 0.450–4.500)

## 2018-01-18 LAB — MAGNESIUM: Magnesium: 2.1 mg/dL (ref 1.6–2.3)

## 2018-02-12 ENCOUNTER — Telehealth: Payer: Self-pay | Admitting: Cardiovascular Disease

## 2018-02-12 NOTE — Telephone Encounter (Signed)
Patient calling back for results  Please call to discuss

## 2018-02-12 NOTE — Telephone Encounter (Signed)
See Long Term Monitor results note. Reviewed with patient.

## 2018-03-02 ENCOUNTER — Ambulatory Visit: Payer: BLUE CROSS/BLUE SHIELD | Admitting: Cardiovascular Disease

## 2019-01-09 ENCOUNTER — Ambulatory Visit (INDEPENDENT_AMBULATORY_CARE_PROVIDER_SITE_OTHER): Payer: BLUE CROSS/BLUE SHIELD | Admitting: Nurse Practitioner

## 2019-01-09 ENCOUNTER — Encounter: Payer: Self-pay | Admitting: Emergency Medicine

## 2019-01-09 ENCOUNTER — Other Ambulatory Visit: Payer: Self-pay

## 2019-01-09 ENCOUNTER — Encounter: Payer: Self-pay | Admitting: Nurse Practitioner

## 2019-01-09 VITALS — BP 124/80 | HR 90 | Temp 98.5°F | Resp 18 | Ht 74.0 in

## 2019-01-09 DIAGNOSIS — R197 Diarrhea, unspecified: Secondary | ICD-10-CM

## 2019-01-09 DIAGNOSIS — R52 Pain, unspecified: Secondary | ICD-10-CM

## 2019-01-09 DIAGNOSIS — K529 Noninfective gastroenteritis and colitis, unspecified: Secondary | ICD-10-CM | POA: Diagnosis not present

## 2019-01-09 DIAGNOSIS — F411 Generalized anxiety disorder: Secondary | ICD-10-CM

## 2019-01-09 DIAGNOSIS — R509 Fever, unspecified: Secondary | ICD-10-CM | POA: Diagnosis not present

## 2019-01-09 LAB — POCT INFLUENZA A/B
INFLUENZA B, POC: NEGATIVE
Influenza A, POC: NEGATIVE

## 2019-01-09 MED ORDER — BUSPIRONE HCL 7.5 MG PO TABS: 7.5000 mg | ORAL_TABLET | Freq: Two times a day (BID) | ORAL | 2 refills | Status: DC

## 2019-01-09 NOTE — Patient Instructions (Addendum)
- [email or web address], online counseling, [facility], - Cognitive behavioral therapy  - Take buspar twice a day for the next 6 weeks and follow up to let us know how it is working   Gastroenteritis (also known as stomach flu, although unrelated to influenza) is inflammation of the gastrointestinal tract, involving both the stomach and intestines. How did I get it? Gastroenteritis can have many causes, including viral or bacterial infections, medication reactions, food allergies, food/water poisoning or abuse of laxatives or alcohol. The duration and severity of the condition is relative to the illness. What are the symptoms? Symptoms can include fatigue, lack of appetite, abdominal growling and cramping, nausea, vomiting and/or diarrhea and are usually brief. Typically, no serious consequences occur and the condition resolves itself in a few days without medical treatment. How do I treat it? -Take nausea medicine as needed for nausea. If needed you can take loperamide (Imodium) for diarrhea, but avoid taking it for more than 2 days at a time as it can then lead to constipation.  -Drink fluids and get plenty of rest. Do not consume alcohol or caffeine. Avoid medications containing aspirin or ibuprofen, which may irritate your stomach, and do not take any medications by mouth unless directed by your medical care provider. 1. Drink clear liquids. Sip water/half-strength sports drinks or suck on ice chips. If you vomit using this treatment, do not take anything for 1 hour and start over again. 2. If you do not vomit fluids, you may progress to full-strength sports drinks; popsicles; clear broth; bouillon; decaf tea; clear apple juice; plain-flavored gelatin; and half-strength, clear, carbonated beverages without fizz (ginger ale, lemon-lime sodas, etc.). NOTE: To remove the fizz from soda, pour some into a glass and stir with a spoon. 3. As you become hungry, try moving to soft foods. Some  examples include: saltine crackers, dry white bread/toast, bananas, apple sauce, plain white rice, soft cereals prepared with water, plain noodles and broth soups. Do not use sauces or condiments, including butter. You may return to a normal diet as tolerated within 24 hours after recovery from vomiting. Recommended diets THINGS TO AVOID WHILE RECOVERING: . Alcohol  . Caffeine  . Dairy products  . Citrus products  . Fatty, greasy and/or fried foods  . Raw fruits and vegetables  . Aspirin  . Ibuprofen CLEAR LIQUID DIET: . Apple, grape or cranberry juice  . Kool-Aid  . Fruit punch  . Gatorade  . Ginger ale or 7UP  . Decaf tea  . Clear bouillon  . Jello  . Popsicles  . Fruit ice  . Salt FULL LIQUID DIET: . Cocoa  . Carbonated, decaf beverages  . Broth  . Strained, bland soups  . Cream of wheat or rice cereals  . Denzil Magnuson  . Vegetable juices  . Strained fruit juices or nectars  . Sherbets  . Honey  . Cinnamon  . Nutmeg  . Vanilla or other extracts CLEAR LIQUID DIET, PLUS: . Coffee  . White bread or toast  . Cooked or ready-to-eat cereal (no bran)  . Graham crackers  . Saltines  . Pasta or rice  . Soft, cooked vegetables  . Boiled or mashed potatoes  . Apple sauce  . Bananas or seedless melon  . Cooked or canned fruits  . Mild cheese or cottage cheese SOFT FULL LIQUID DIET, PLUS: . Soft-cooked, poached or hard-boiled or scrambled eggs  . Tender meat, fish or poultry  . Soft cake or cookies without nuts or raisins  .  Butter, cream or margarine  . Jelly  SEEK MEDICAL TREATMENT IF: . You are unable to keep fluids down.  . You see blood or mucus in your stool.  . You vomit black or dark red material.  . You have a fever of 101?F (38.33?C) or higher.  . You have localized and/or persistent abdominal pain.

## 2019-01-09 NOTE — Progress Notes (Signed)
Name: Gavin Johnson   MRN: [id number]    DOB: 1995-12-11   Date:01/09/2019       Progress Note  Subjective  Chief Complaint  Chief Complaint  Patient presents with  . Diarrhea  . Fever    99.5 last night    HPI  Patient endorses diarrhea, body aches and fatigue and low grade fever started 2-3 days ago. Patient states initially had 5-6 episodes, states has had 1 episode in the last 24 hours. Initially bristol scale 7 now bristol scale 5. Patient endorses some abdominal cramping that is relieved with bowel movement. States employer wants note to return to work. Denies nausea, vomiting, blood in stools.  Took ibuprofen for body aches and intermittent headaches.  Drinking plenty of water 6-7 ounces, one gatorade a day.  No gluten in the house, dad had similar symptoms that resolved.   Patient has had panic attacks, typically before bedtime, states usually has generalized anxiety for a period of time a 1-2 months a year since he was 68 and anxiety gets worse at night. States started in September and went away in December but was having nightly panic attacks during that time. He decided to cut out caffeine and alcohol with some relief states feels much better now but wondering what options are for the future.   GAD 7 : Generalized Anxiety Score 01/09/2019  Nervous, Anxious, on Edge 1  Control/stop worrying 1  Worry too much - different things 0  Trouble relaxing 1  Restless 1  Easily annoyed or irritable 2  Afraid - awful might happen 0  Total GAD 7 Score 6  Anxiety Difficulty Somewhat difficult     PHQ2/9: Depression screen [facility] 2/9 01/09/2019 11/17/2017 07/08/2016 07/01/2015  Decreased Interest 0 0 0 0  Down, Depressed, Hopeless 0 0 0 0  PHQ - 2 Score 0 0 0 0  Altered sleeping 0 - - -  Tired, decreased energy 0 - - -  Change in appetite 0 - - -  Feeling bad or failure about yourself  0 - - -  Trouble concentrating 0 - - -  Moving slowly or fidgety/restless 0 - - -  Suicidal  thoughts 0 - - -  PHQ-9 Score 0 - - -  Difficult doing work/chores Not difficult at all - - -    PHQ reviewed. Negative  Patient Active Problem List   Diagnosis Date Noted  . Dilated aortic root (HCC) 01/17/2018  . Family hx of aortic aneurysm 07/01/2015  . Preventative health care 07/01/2015  . Celiac disease     Past Medical History:  Diagnosis Date  . Celiac disease   . Dilated aortic root (HCC) 01/17/2018    Past Surgical History:  Procedure Laterality Date  . TONSILLECTOMY      Social History   Tobacco Use  . Smoking status: Never Smoker  . Smokeless tobacco: Current User    Types: Chew  Substance Use Topics  . Alcohol use: Yes    Comment: socially     Current Outpatient Medications:  .  albuterol (PROAIR HFA) 108 (90 Base) MCG/ACT inhaler, Inhale 2 puffs into the lungs every 4 (four) hours as needed for wheezing or shortness of breath. (Patient not taking: Reported on 01/09/2019), Disp: 1 Inhaler, Rfl: 1 .  fluticasone (FLONASE) 50 MCG/ACT nasal spray, Place 2 sprays into both nostrils daily. (Patient not taking: Reported on 01/09/2019), Disp: 16 g, Rfl: 6 .  loratadine (CLARITIN) 10 MG tablet, Take 1 tablet (10  mg total) by mouth daily. (Patient not taking: Reported on 01/09/2019), Disp: 30 tablet, Rfl: 11  Allergies  Allergen Reactions  . Acetaminophen Hives  . Amoxil [Amoxicillin] Rash    ROS    No other specific complaints in a complete review of systems (except as listed in HPI above).  Objective  Vitals:   01/09/19 1338  BP: 124/80  Pulse: 90  Resp: 18  Temp: 98.5 F (36.9 C)  TempSrc: Oral  SpO2: 99%  Height: 6\' 2"  (1.88 m)    Body mass index is 28.21 kg/m.  Nursing Note and Vital Signs reviewed.  Physical Exam Constitutional:      Appearance: Normal appearance. He is well-developed.  HENT:     Head: Normocephalic and atraumatic.     Right Ear: Hearing normal.     Left Ear: Hearing normal.  Eyes:     Conjunctiva/sclera:  Conjunctivae normal.  Cardiovascular:     Rate and Rhythm: Normal rate and regular rhythm.     Heart sounds: Normal heart sounds.  Pulmonary:     Effort: Pulmonary effort is normal.     Breath sounds: Normal breath sounds.  Abdominal:     Tenderness: There is no abdominal tenderness. There is no right CVA tenderness or left CVA tenderness.  Musculoskeletal: Normal range of motion.  Neurological:     Mental Status: He is alert and oriented to person, place, and time.  Psychiatric:        Speech: Speech normal.        Behavior: Behavior normal. Behavior is cooperative.        Thought Content: Thought content normal.        Judgment: Judgment normal.       No results found for this or any previous visit (from the past 48 hour(s)).  Assessment & Plan  1. Fever and chills - POCT Influenza A/B  2. Generalized body aches - POCT Influenza A/B  3. Diarrhea of presumed infectious origin - POCT Influenza A/B  4. Gastroenteritis Symptoms vastly improving, discussed hydration, slowly advance diet, if not resolved or worsening let us know.   5. GAD (generalized anxiety disorder) Discussed CBT and given resources patient plans on starting. Discussed medication options patient did not want to try SSRI's states he read into them and did not like altering brain chemistry. Will trial buspar and follow-up in 6 weeks.  - busPIRone (BUSPAR) 7.5 MG tablet; Take 1 tablet (7.5 mg total) by mouth 2 (two) times daily.  Dispense: 60 tablet; Refill: 2  Face-to-face time with patient was more than 25 minutes, >50% time spent counseling and coordination of care

## 2019-02-21 ENCOUNTER — Ambulatory Visit: Payer: BLUE CROSS/BLUE SHIELD | Admitting: Family Medicine

## 2019-02-21 ENCOUNTER — Encounter: Payer: Self-pay | Admitting: Nurse Practitioner

## 2019-02-21 ENCOUNTER — Ambulatory Visit (INDEPENDENT_AMBULATORY_CARE_PROVIDER_SITE_OTHER): Payer: BLUE CROSS/BLUE SHIELD | Admitting: Nurse Practitioner

## 2019-02-21 ENCOUNTER — Other Ambulatory Visit: Payer: Self-pay

## 2019-02-21 DIAGNOSIS — F411 Generalized anxiety disorder: Secondary | ICD-10-CM

## 2019-02-21 MED ORDER — BUSPIRONE HCL 7.5 MG PO TABS: 7.5000 mg | ORAL_TABLET | Freq: Two times a day (BID) | ORAL | 2 refills | Status: DC

## 2019-02-21 NOTE — Progress Notes (Signed)
Virtual Visit via Video Note  I connected with Gavin Johnson on 02/21/19 at 10:00 AM EDT by a video enabled telemedicine application and verified that I am speaking with the correct person using two identifiers.   Staff discussed the limitations of evaluation and management by telemedicine and the availability of in person appointments. The patient expressed understanding and agreed to proceed.  Patient location: home  My location: home office Other people present:  none HPI  Patient presents for follow-up on anxiety started on buspar 7.5mg  BID. Taking it as prescribed without missed doses. Seem to work great, positive effect. States anxiety isn't completely resolved but doing well. States he has had 2 episodes of dizziness upon initially starting it, last one was 3 weeks ago but this has subsided. States has had rare episodes of short-term memory issues- started a sentences and then forgot what he was going to say. Says this has been rare as well, attributes to medicine but would like to continue.  Marland Kitchen  GAD 7 : Generalized Anxiety Score 02/21/2019 01/09/2019  Nervous, Anxious, on Edge 1 1  Control/stop worrying 1 1  Worry too much - different things 0 0  Trouble relaxing 1 1  Restless 0 1  Easily annoyed or irritable 2 2  Afraid - awful might happen 0 0  Total GAD 7 Score 5 6  Anxiety Difficulty Not difficult at all Somewhat difficult     PHQ2/9: Depression screen [facility] 2/9 02/21/2019 01/09/2019 11/17/2017 07/08/2016 07/01/2015  Decreased Interest 0 0 0 0 0  Down, Depressed, Hopeless 0 0 0 0 0  PHQ - 2 Score 0 0 0 0 0  Altered sleeping 0 0 - - -  Tired, decreased energy 0 0 - - -  Change in appetite 0 0 - - -  Feeling bad or failure about yourself  0 0 - - -  Trouble concentrating 0 0 - - -  Moving slowly or fidgety/restless 0 0 - - -  Suicidal thoughts 0 0 - - -  PHQ-9 Score 0 0 - - -  Difficult doing work/chores Not difficult at all Not difficult at all - - -     PHQ reviewed.  Negative  Patient Active Problem List   Diagnosis Date Noted  . Dilated aortic root (HCC) 01/17/2018  . Family hx of aortic aneurysm 07/01/2015  . Preventative health care 07/01/2015  . Celiac disease     Past Medical History:  Diagnosis Date  . Celiac disease   . Dilated aortic root (HCC) 01/17/2018    Past Surgical History:  Procedure Laterality Date  . TONSILLECTOMY      Social History   Tobacco Use  . Smoking status: Never Smoker  . Smokeless tobacco: Current User    Types: Chew  Substance Use Topics  . Alcohol use: Yes    Comment: socially     Current Outpatient Medications:  .  busPIRone (BUSPAR) 7.5 MG tablet, Take 1 tablet (7.5 mg total) by mouth 2 (two) times daily., Disp: 60 tablet, Rfl: 2 .  albuterol (PROAIR HFA) 108 (90 Base) MCG/ACT inhaler, Inhale 2 puffs into the lungs every 4 (four) hours as needed for wheezing or shortness of breath. (Patient not taking: Reported on 01/09/2019), Disp: 1 Inhaler, Rfl: 1 .  fluticasone (FLONASE) 50 MCG/ACT nasal spray, Place 2 sprays into both nostrils daily. (Patient not taking: Reported on 01/09/2019), Disp: 16 g, Rfl: 6 .  loratadine (CLARITIN) 10 MG tablet, Take 1 tablet (10 mg total)  by mouth daily. (Patient not taking: Reported on 01/09/2019), Disp: 30 tablet, Rfl: 11  Allergies  Allergen Reactions  . Acetaminophen Hives  . Amoxil [Amoxicillin] Rash    ROS   No other specific complaints in a complete review of systems (except as listed in HPI above).  Objective  There were no vitals filed for this visit.   There is no height or weight on file to calculate BMI.  Nursing Note and Vital Signs reviewed.  Physical Exam  Constitutional: Patient appears well-developed and well-nourished. No distress.  HENT: Head: Normocephalic and atraumatic. Pulmonary/Chest: Effort normal  Musculoskeletal: Normal range of motion,  Neurological: he is alert and oriented  Skin: No rash noted. No erythema.  Psychiatric:  Patient has a normal mood and affect. behavior is normal. Judgment and thought content normal.    Assessment & Plan  1. GAD (generalized anxiety disorder) Much improved, recommend counseling additionally.  - busPIRone (BUSPAR) 7.5 MG tablet; Take 1 tablet (7.5 mg total) by mouth 2 (two) times daily.  Dispense: 180 tablet; Refill: 2   Follow Up Instructions:  6 months for physical   I discussed the assessment and treatment plan with the patient. The patient was provided an opportunity to ask questions and all were answered. The patient agreed with the plan and demonstrated an understanding of the instructions.   The patient was advised to call back or seek an in-person evaluation if the symptoms worsen or if the condition fails to improve as anticipated.  I provided 12 minutes of non-face-to-face time during this encounter.   Cheryle HorsfallElizabeth E Verneta Hamidi, NP

## 2019-03-09 ENCOUNTER — Other Ambulatory Visit: Payer: Self-pay | Admitting: Nurse Practitioner

## 2019-03-09 DIAGNOSIS — F411 Generalized anxiety disorder: Secondary | ICD-10-CM

## 2019-08-29 ENCOUNTER — Encounter: Payer: BLUE CROSS/BLUE SHIELD | Admitting: Family Medicine

## 2020-03-10 ENCOUNTER — Ambulatory Visit: Payer: Self-pay | Admitting: Family Medicine

## 2020-03-11 ENCOUNTER — Encounter: Payer: Self-pay | Admitting: Internal Medicine

## 2020-03-11 ENCOUNTER — Other Ambulatory Visit: Payer: Self-pay

## 2020-03-11 ENCOUNTER — Ambulatory Visit: Payer: BC Managed Care – PPO | Admitting: Internal Medicine

## 2020-03-11 VITALS — BP 124/88 | HR 78 | Temp 98.5°F | Resp 16 | Ht 74.0 in | Wt 226.4 lb

## 2020-03-11 DIAGNOSIS — M26629 Arthralgia of temporomandibular joint, unspecified side: Secondary | ICD-10-CM

## 2020-03-11 DIAGNOSIS — F411 Generalized anxiety disorder: Secondary | ICD-10-CM

## 2020-03-11 HISTORY — DX: Arthralgia of temporomandibular joint, unspecified side: M26.629

## 2020-03-11 MED ORDER — BUSPIRONE HCL 5 MG PO TABS: 5.0000 mg | ORAL_TABLET | Freq: Two times a day (BID) | ORAL | 1 refills | Status: DC

## 2020-03-11 NOTE — Patient Instructions (Signed)

## 2020-03-11 NOTE — Progress Notes (Signed)
Patient ID: Gavin Johnson, male    DOB: [birth year], 24 y.o.   MRN: [id number]  PCP: Jamelle Haring, MD  Chief Complaint  Patient presents with  . Ear Pain    onset a couple of months ago  . Jaw Pain    Subjective:   Gavin Johnson is a 24 y.o. male, presents to clinic with CC of the following:  Chief Complaint  Patient presents with  . Ear Pain    onset a couple of months ago  . Jaw Pain    HPI:  Patient is a 24 year old male who presents today with ear pain that has been present for a couple months, and also some jaw discomfort. He notes over the past couple months he has some pains intermittently that are felt sometimes in the right ear, sometimes beneath the ear and the jaw, sometimes feels it down the neck a little bit further, and sometimes even has some throat discomfort with this.  He felt like he pulled a muscle in the front of his neck with a strain previously before her symptoms started.  Sometimes it is worse with food and eating.  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  He notes the symptoms last for 30 minutes to 1 hour, and then go away, and he has tried ibuprofen at times without significant relief when he is having the symptoms.  Denies any fevers, no increased mucus, no cough, and is not having symptoms presently today.  Of note, he was started on BuSpar short time back as he was having anxiety and some panic episodes at night, having difficulty getting to sleep, and since starting the BuSpar, those symptoms have pretty much resolved.  He is on 7.5 mg of BuSpar, and with his new insurance, the pharmacist noted he should be on either 5 mg or 15 mg and it will save him significant money.  After discussion today, he wanted to try the 5 mg dose and see if his symptoms remained controlled, and I felt that was reasonable, and did change the prescription for  him.  Tobacco-never smoker  Patient Active Problem List   Diagnosis Date Noted  . Dilated aortic root (HCC) 01/17/2018  . Family hx of aortic aneurysm 07/01/2015  . Preventative health care 07/01/2015  . Celiac disease       Current Outpatient Medications:  .  busPIRone (BUSPAR) 7.5 MG tablet, TAKE ONE TABLET BY MOUTH TWICE DAILY, Disp: 180 tablet, Rfl: 3   Allergies  Allergen Reactions  . Amoxil [Amoxicillin] Rash     Past Surgical History:  Procedure Laterality Date  . TONSILLECTOMY       Family History  Problem Relation Age of Onset  . Arthritis Mother   . Aortic aneurysm Father   . Cancer Maternal Grandfather        colon  . Diabetes Neg Hx      Social History   Tobacco Use  . Smoking status: Never Smoker  . Smokeless tobacco: Current User    Types: Chew  Substance Use Topics  . Alcohol use: Yes    Comment: socially    With staff assistance, above reviewed with the patient today.  ROS: As per HPI, otherwise no specific complaints on a limited and focused system review   No results found for this or any previous visit (from the past 72 hour(s)).  PHQ2/9: Depression screen [facility] 2/9 03/11/2020 02/21/2019 01/09/2019 11/17/2017 07/08/2016  Decreased Interest 0 0 0 0 0  Down, Depressed, Hopeless 0 0 0 0 0  PHQ - 2 Score 0 0 0 0 0  Altered sleeping 0 0 0 - -  Tired, decreased energy 0 0 0 - -  Change in appetite 0 0 0 - -  Feeling bad or failure about yourself  0 0 0 - -  Trouble concentrating 0 0 0 - -  Moving slowly or fidgety/restless 0 0 0 - -  Suicidal thoughts 0 0 0 - -  PHQ-9 Score 0 0 0 - -  Difficult doing work/chores Not difficult at all Not difficult at all Not difficult at all - -   PHQ-2/9 Result is neg  Fall Risk: Fall Risk  03/11/2020 02/21/2019 01/09/2019 11/17/2017 07/08/2016  Falls in the past year? 0 0 0 No No  Number falls in past yr: 0 0 0 - -  Injury with Fall? 0 0 0 - -  Follow up - Falls evaluation completed Falls evaluation  completed - -      Objective:   Vitals:   03/11/20 1434  BP: 124/88  Pulse: 78  Resp: 16  Temp: 98.5 F (36.9 C)  TempSrc: Temporal  SpO2: 97%  Weight: 226 lb 6.4 oz (102.7 kg)  Height: 6\' 2"  (1.88 m)    Body mass index is 29.07 kg/m.  Physical Exam   NAD, masked, looks well, pleasant HEENT - [place]/AT, sclera anicteric, PERRL, EOMI, conj - non-inj'ed, TM's and canals clear, nontender with tugging on the lobe of the right ear, pharynx mildly erythematous with no exudates, no other lesions noted Neck - supple, no adenopathy, no TM, no rigidity, no pain with neck range of motion He was tender at the right TMJ joint with palpation, slightly increased with opening and closing his jaw, with no major disruption of the TMJ joint noted no swelling, no overlying erythema, not markedly tender in the right lower jaw region and extending down the neck anteriorly in the area he noted he sometimes feels discomfort as well. Car - RRR without m/g/r Pulm- RR and effort normal at rest, CTA without wheeze or rales Abd - soft, NT Neuro/psychiatric - affect was not flat, appropriate with conversation  Alert and oriented  Speech  normal     Assessment & Plan:  1. GAD (generalized anxiety disorder) As above noted, did refill the buspirone for him today at the 5 mg dose (reduced from the 7.5 mg dose) to take twice daily He noted he did not see counseling in the past and that the BuSpar was helpful, and assess his response to the above. - busPIRone (BUSPAR) 5 MG tablet; Take 1 tablet (5 mg total) by mouth 2 (two) times daily.  Dispense: 180 tablet; Refill: 1  2. TMJPDS (temporomandibular joint pain dysfunction syndrome) Educated on this entity, and do feel it is a likely source to his symptoms.  Noted a little confused with the throat soreness, as often that is not a big problem with TMJ dysfunction, and there is no evidence of infection today with no adenopathy, fever, or exudates of concern. He notes  he is a with some anxiety issues, and likely contributing as well. Recommended ice massage to the area, can use ibuprofen products with food as needed, and noted he can try a generic Aleve (naproxen) product in its place, taking 2 up to twice daily with food and assess response.  Relative jaw rest also emphasized Discussed possibly a bite splint over time may be needed, and also our dental colleagues help with that and may pursue if not improving or worsening with the above.  We will follow-up if not improving or more problematic over time, or if other symptoms are arising as well       Towanda Malkin, MD 03/11/20 2:51 PM

## 2020-03-30 ENCOUNTER — Ambulatory Visit: Payer: Self-pay | Admitting: Family Medicine

## 2020-06-11 ENCOUNTER — Other Ambulatory Visit: Payer: Self-pay | Admitting: Internal Medicine

## 2020-06-11 DIAGNOSIS — F411 Generalized anxiety disorder: Secondary | ICD-10-CM

## 2020-07-29 DIAGNOSIS — Z20822 Contact with and (suspected) exposure to covid-19: Secondary | ICD-10-CM | POA: Diagnosis not present

## 2020-07-29 DIAGNOSIS — Z0289 Encounter for other administrative examinations: Secondary | ICD-10-CM | POA: Diagnosis not present

## 2020-09-14 ENCOUNTER — Other Ambulatory Visit: Payer: Self-pay | Admitting: Internal Medicine

## 2020-09-14 ENCOUNTER — Telehealth: Payer: Self-pay | Admitting: Internal Medicine

## 2020-09-14 DIAGNOSIS — F411 Generalized anxiety disorder: Secondary | ICD-10-CM

## 2020-09-14 NOTE — Telephone Encounter (Signed)
Gavin Johnson routed conversation to [facility] Nurse Triage Pool 3 minutes ago (9:40 AM)  Gavin Johnson 3 minutes ago (9:40 AM)  CS   PT need a refill  busPIRone (BUSPAR) 5 MG tablet [[id number] [facility] - [place], [place] - [address]  [address] [place] [address]  Phone: [phone] Fax: [phone]         Documentation   [place], [place] [phone]  Gavin Johnson

## 2020-09-14 NOTE — Telephone Encounter (Signed)
PT need a refill  busPIRone (BUSPAR) 5 MG tablet [[id number] [facility] - [place], [place] - [address]  [place] [address]  Phone: [phone] Fax: [phone]

## 2020-09-14 NOTE — Telephone Encounter (Signed)
See 2nd telephone encounter for 09/14/20.

## 2020-11-25 ENCOUNTER — Encounter: Payer: Self-pay | Admitting: Family Medicine

## 2020-11-25 ENCOUNTER — Ambulatory Visit: Payer: BC Managed Care – PPO | Admitting: Family Medicine

## 2020-11-25 ENCOUNTER — Other Ambulatory Visit: Payer: Self-pay

## 2020-11-25 VITALS — BP 118/72 | HR 93 | Temp 98.4°F | Resp 18 | Ht 74.0 in | Wt 229.3 lb

## 2020-11-25 DIAGNOSIS — R002 Palpitations: Secondary | ICD-10-CM

## 2020-11-25 DIAGNOSIS — K900 Celiac disease: Secondary | ICD-10-CM | POA: Diagnosis not present

## 2020-11-25 DIAGNOSIS — F411 Generalized anxiety disorder: Secondary | ICD-10-CM | POA: Diagnosis not present

## 2020-11-25 DIAGNOSIS — Z8249 Family history of ischemic heart disease and other diseases of the circulatory system: Secondary | ICD-10-CM

## 2020-11-25 DIAGNOSIS — I77810 Thoracic aortic ectasia: Secondary | ICD-10-CM | POA: Diagnosis not present

## 2020-11-25 MED ORDER — BUSPIRONE HCL 10 MG PO TABS: 5.0000 mg | ORAL_TABLET | Freq: Two times a day (BID) | ORAL | 3 refills | Status: DC | PRN

## 2020-11-25 NOTE — Progress Notes (Signed)
Name: Gavin Johnson   MRN: [id number]    DOB: Feb 18, 1996   Date:11/25/2020       Progress Note  Chief Complaint  Patient presents with  . Medication Refill     Subjective:   Gavin Johnson is a 25 y.o. male, presents to clinic for med refill/f/up and to transition care to new PCP  Here for med refill on buspar- uses for anxiety/panic attacks He takes BuSpar 5 mg twice a day at 8 AM and 8 PM He reports previously being on SSRIs and benzos and he did not like either of them -does not want to be on SSRI Feels he is currently coping well with his symptoms They do tend to get worse at night when he lays down to bed -often has palpitations at that time of night he is not sure if it is " in his head" but he tends to worry about it sometimes it makes his symptoms worse.  Palpitations and panic attacks are both intermittent.    GAD-7 and PHQ-9 reviewed and noted below, mildly positive anxiety screening, negative depression screening patient denies any depressive symptoms or feeling down no history of suicidal thoughts Overall he is getting okay sleep No family history of anxiety in his parents but no other mood disorder substance abuse or past traumatic experiences that he speaks of today    He does have a history of cardiac work-up with echo and Holter monitor in the past Hx of palpitations usually at night Family hx of aortic aneurysm in father and grandfather  Past echoes were reviewed echo was done in 2017 and again in 2019 and he was lost to follow-up with Dr. Kirke Corin He was due to follow-up with him and review Holter monitor results Patient states that palpitations come and go, usually at bedtime, no change in frequency, duration, intensity He has noted some diffierent quality in the past couple months-in the past he is only felt a forceful pounding or rapid heart rate and more recently he has felt that he had a skipped beat and then following forceful beat.  He has no noted  correlation of onset of palpitations with any activity or with positional changes.  He has had a few isolated incidents of dizziness in the past month but he denies any associated near syncope, diaphoresis, chest pain, shortness of breath.  He moves and delivers cabinets full-time and does a lot of physical activity and strenuous work and he has not had any exertional symptoms   Anxiety disorder and history of panic attacks GAD 7 : Generalized Anxiety Score 11/25/2020 02/21/2019 01/09/2019  Nervous, Anxious, on Edge 1 1 1   Control/stop worrying 1 1 1   Worry too much - different things 1 0 0  Trouble relaxing 0 1 1  Restless 1 0 1  Easily annoyed or irritable 1 2 2   Afraid - awful might happen 1 0 0  Total GAD 7 Score 6 5 6   Anxiety Difficulty Not difficult at all Not difficult at all Somewhat difficult   Depression screen [facility] 2/9 11/25/2020 03/11/2020 02/21/2019  Decreased Interest 0 0 0  Down, Depressed, Hopeless 0 0 0  PHQ - 2 Score 0 0 0  Altered sleeping 2 0 0  Tired, decreased energy 0 0 0  Change in appetite 0 0 0  Feeling bad or failure about yourself  0 0 0  Trouble concentrating 1 0 0  Moving slowly or fidgety/restless 0 0 0  Suicidal thoughts  0 0 0  PHQ-9 Score 3 0 0  Difficult doing work/chores Not difficult at all Not difficult at all Not difficult at all   Patient has not talked with therapist feels that he is coping well with his symptoms he has not seen psychiatry in the past and declines any referrals at this time  Celiac disease-he states that he is never seen a GI specialist his father has celiac's and patient had similar symptoms which all resolved when he avoided gluten in his diet.  He denies abdominal pain, diarrhea, joint pain, rashes     Current Outpatient Medications:  .  busPIRone (BUSPAR) 5 MG tablet, Take 1 tablet by mouth 2 (two) times daily., Disp: , Rfl:   Patient Active Problem List   Diagnosis Date Noted  . TMJPDS (temporomandibular joint pain  dysfunction syndrome) 03/11/2020  . GAD (generalized anxiety disorder) 03/11/2020  . Dilated aortic root (HCC) 01/17/2018  . Family hx of aortic aneurysm 07/01/2015  . Preventative health care 07/01/2015  . Celiac disease     Past Surgical History:  Procedure Laterality Date  . TONSILLECTOMY      Family History  Problem Relation Age of Onset  . Arthritis Mother   . Aortic aneurysm Father   . Cancer Maternal Grandfather        colon  . Diabetes Neg Hx     Social History   Tobacco Use  . Smoking status: Never Smoker  . Smokeless tobacco: Current User    Types: Chew  Substance Use Topics  . Alcohol use: Yes    Comment: socially  . Drug use: No     Allergies  Allergen Reactions  . Amoxil [Amoxicillin] Rash    Health Maintenance  Topic Date Due  . Hepatitis C Screening  Never done  . COVID-19 Vaccine (1) Never done  . INFLUENZA VACCINE  01/21/2021 (Originally 05/24/2020)  . TETANUS/TDAP  02/19/2026  . HIV Screening  Completed    Chart Review Today: I personally reviewed active problem list, medication list, allergies, family history, social history, health maintenance, notes from last encounter, lab results, imaging with the patient/caregiver today.    Review of Systems  Constitutional: Negative.  Negative for activity change, appetite change, chills, diaphoresis, fatigue, fever and unexpected weight change.  HENT: Negative.   Eyes: Negative.   Respiratory: Negative.  Negative for apnea, choking, chest tightness, shortness of breath, wheezing and stridor.   Cardiovascular: Positive for palpitations. Negative for chest pain and leg swelling.  Gastrointestinal: Negative.  Negative for abdominal pain.  Endocrine: Negative.   Genitourinary: Negative.   Musculoskeletal: Negative.   Skin: Negative.   Allergic/Immunologic: Negative.   Neurological: Positive for dizziness. Negative for tremors, syncope, weakness, light-headedness, numbness and headaches.   Hematological: Negative.   Psychiatric/Behavioral: Negative.  Negative for agitation, behavioral problems, confusion, decreased concentration, dysphoric mood, self-injury, sleep disturbance and suicidal ideas.  All other systems reviewed and are negative.    Objective:   Vitals:   11/25/20 1000  BP: 118/72  Pulse: 93  Resp: 18  Temp: 98.4 F (36.9 C)  TempSrc: Oral  SpO2: 98%  Weight: 229 lb 4.8 oz (104 kg)  Height: 6\' 2"  (1.88 m)    Body mass index is 29.44 kg/m.  Physical Exam Vitals and nursing note reviewed.  Constitutional:      General: He is not in acute distress.    Appearance: Normal appearance. He is well-developed. He is not ill-appearing, toxic-appearing or diaphoretic.  Interventions: Face mask in place.  HENT:     Head: Normocephalic and atraumatic.     Jaw: No trismus.     Right Ear: External ear normal.     Left Ear: External ear normal.  Eyes:     General: Lids are normal. No scleral icterus.       Right eye: No discharge.        Left eye: No discharge.     Conjunctiva/sclera: Conjunctivae normal.  Neck:     Trachea: Trachea and phonation normal. No tracheal deviation.  Cardiovascular:     Rate and Rhythm: Normal rate and regular rhythm.     Pulses: Normal pulses.          Radial pulses are 2+ on the right side and 2+ on the left side.       Posterior tibial pulses are 2+ on the right side and 2+ on the left side.     Heart sounds: Normal heart sounds. No murmur heard. No friction rub. No gallop.   Pulmonary:     Effort: Pulmonary effort is normal. No respiratory distress.     Breath sounds: Normal breath sounds. No stridor. No wheezing, rhonchi or rales.  Abdominal:     General: Bowel sounds are normal. There is no distension.     Palpations: Abdomen is soft.  Musculoskeletal:     Right lower leg: No edema.     Left lower leg: No edema.  Skin:    General: Skin is warm and dry.     Coloration: Skin is not jaundiced.     Findings: No  rash.     Nails: There is no clubbing.  Neurological:     Mental Status: He is alert. Mental status is at baseline.     Cranial Nerves: No dysarthria or facial asymmetry.     Motor: No tremor or abnormal muscle tone.     Gait: Gait normal.  Psychiatric:        Attention and Perception: Attention normal.        Mood and Affect: Mood and affect normal.        Speech: Speech normal.        Behavior: Behavior normal. Behavior is cooperative.        Thought Content: Thought content normal.      Records reviewed - personally reviewed past ECHO 2016 and 2019, past EKG past holter monitor results, cardiology consults - spent more than 10 min on chart and record review   Assessment & Plan:     ICD-10-CM   1. GAD (generalized anxiety disorder)  F41.1 busPIRone (BUSPAR) 10 MG tablet   Well-controlled with BuSpar and coping skills, patient declines any referrals at this time is interested in possibly having a little higher dose available Can try 5-10 mg BID PRN - refills entered, consider sooner f/up if any worsening sx   2. Celiac disease  K90.0    Well-controlled with avoiding gluten in diet no abdominal pain diarrhea bloody stools or other systemic symptoms of disease Continue gluten free diet   3. Palpitations  R00.2    Past cardiac consult lost to follow-up in 2018 continued palpitations and sensation of skipped beats, reviewed cardiac procedures and testing in the past On Exam today HR RRR, no M, G, R Dizziness episodes few in the past couple months independent of palpitations Palpitations occur most often at night but he states that they come and go randomly He has had some skipped beats  and forceful contractions that did not have any associated concerning cardiac symptoms he has not had any exertional chest pain, near syncope, orthopnea, lower extremity edema, PND or diaphoresis  I reviewed cardiac notes and cardiac testing because he did not follow-up with Dr. Kirke Corin for his last  scheduled appointment I cannot tell if they want to do any routine follow-up for monitoring is dilated aorta with his family history  I do think with the change in his palpitations and with his history as noted above and below that he should at least reestablish care with cardiology and see what they say  The patient's blood pressure and other vital signs were stable today and he is very well-appearing  We discussed following up with cardiology which she is in agreement with     4. Dilated aortic root (HCC)  I77.810    Follow-up with cardiology for monitoring   5. Family hx of aortic aneurysm  Z82.49    The last echo showed aortic root was normal and aorta was mildly dilated, lost to follow-up  Results from 01/18/2018: EF normal with normal wall motion.  Stable aortic root and ascending aorta.  Continue with Zio monitor and follow up with Dr. Kirke Corin.  No follow-up appointment was completed after 7-day Holter monitor was completed -  Interpretation and result note from Nicolasa Ducking, NP: Creig Hines, NP  02/12/2018 11:31 AM EDT      Monitoring showed occasional isolated beats from the top or bottom portions of the heart. 2 short episodes of rapid heart rate - one stemming from the top portion of the heart (4 beats) and one from the bottom (6 beats). Based on diary, it does not appear that either were symptomatic. With normal heart squeezing function and nl electrolytes and TSH, these are mostly likely benign, though sometimes we use a low dose of a medicine to slow the heart beat, especially if someone is symptomatic. Patient activations were linked to sinus rhythm/sinus tachycardia w/o any significant arrhythmias, which is reassuring. Follow up with Dr. Kirke Corin in May as planned.   We discussed medication, patient has never been on any beta-blockers before  Patient was scheduled for follow-up with Dr. Kirke Corin Mar 02, 2018 and encounter never occurred was completed  Pt  does not drink caffeine, thyroid has been screened in the past, he does have some nicotine use explained that that is a stimulant and will increase heart rate palpitations, blood pressure and to try and avoid  Patient does express that he has a lot of anxiety about his physical symptoms and then also tells himself that this might just be his anxiety I explained that repeated cardiac work-ups to reassure him of his heart health is usually common in order to rule out cardiac pathology and reassure that these are somatic symptoms secondary to anxiety or panic disorders - very common and very difficult to tell the difference.  At this time he does not have any symptoms concerning enough to get a stat referral or to send him to the hospital but continue monitoring and in the next few months get reestablished with cardiology  Encouraged him to return for a annual physical to do other screenings and health assessment and to recheck his symptoms and med changes as noted above   Danelle Berry, PA-C 11/25/20 10:31 AM

## 2020-12-03 ENCOUNTER — Other Ambulatory Visit
Admission: RE | Admit: 2020-12-03 | Discharge: 2020-12-03 | Disposition: A | Discharge status: Home / Self Care | Payer: BC Managed Care – PPO | Source: Ambulatory Visit | Attending: Cardiovascular Disease | Admitting: Cardiovascular Disease

## 2020-12-03 ENCOUNTER — Encounter: Payer: Self-pay | Admitting: Cardiovascular Disease

## 2020-12-03 ENCOUNTER — Ambulatory Visit (INDEPENDENT_AMBULATORY_CARE_PROVIDER_SITE_OTHER): Payer: BC Managed Care – PPO | Admitting: Cardiovascular Disease

## 2020-12-03 ENCOUNTER — Other Ambulatory Visit: Payer: Self-pay

## 2020-12-03 VITALS — BP 130/94 | HR 84 | Ht 74.0 in | Wt 227.0 lb

## 2020-12-03 DIAGNOSIS — I77810 Thoracic aortic ectasia: Secondary | ICD-10-CM | POA: Diagnosis not present

## 2020-12-03 DIAGNOSIS — R002 Palpitations: Secondary | ICD-10-CM

## 2020-12-03 LAB — BASIC METABOLIC PANEL
Anion gap: 14 (ref 5–15)
BUN: 19 mg/dL (ref 6–20)
CO2: 20 mmol/L — ABNORMAL LOW (ref 22–32)
Calcium: 9.4 mg/dL (ref 8.9–10.3)
Chloride: 102 mmol/L (ref 98–111)
Creatinine, Ser: 1.13 mg/dL (ref 0.61–1.24)
GFR, Estimated: >60 mL/min (ref >60)
Glucose, Bld: 78 mg/dL (ref 70–99)
Potassium: 3.8 mmol/L (ref 3.5–5.1)
Sodium: 136 mmol/L (ref 135–145)

## 2020-12-03 NOTE — Patient Instructions (Signed)
Medication Instructions:  Your physician recommends that you continue on your current medications as directed. Please refer to the Current Medication list given to you today.  *If you need a refill on your cardiac medications before your next appointment, please call your pharmacy*   Lab Work: BMP today.  Please have your lab drawn at the medical mall. Stop at the registration desk to check in. If you have labs (blood work) drawn today and your tests are completely normal, you will receive your results only by: Marland Kitchen MyChart Message (if you have MyChart) OR . A paper copy in the mail If you have any lab test that is abnormal or we need to change your treatment, we will call you to review the results.   Testing/Procedures: Non-Cardiac CT Angiography (CTA), is a special type of CT scan that uses a computer to produce multi-dimensional views of major blood vessels throughout the body. In CT angiography, a contrast material is injected through an IV to help visualize the blood vessels  --Please call [phone] to schedule---  Follow-Up: At [facility], you and your health needs are our priority.  As part of our continuing mission to provide you with exceptional heart care, we have created designated Provider Care Teams.  These Care Teams include your primary Cardiologist (physician) and Advanced Practice Providers (APPs -  Physician Assistants and Nurse Practitioners) who all work together to provide you with the care you need, when you need it.  We recommend signing up for the patient portal called "MyChart".  Sign up information is provided on this After Visit Summary.  MyChart is used to connect with patients for Virtual Visits (Telemedicine).  Patients are able to view lab/test results, encounter notes, upcoming appointments, etc.  Non-urgent messages can be sent to your provider as well.   To learn more about what you can do with MyChart, go to [email or web address].    Your next  appointment:   Your physician wants you to follow-up in: 1 Year You will receive a reminder letter in the mail two months in advance. If you don't receive a letter, please call our office to schedule the follow-up appointment.   The format for your next appointment:   In Person  Provider:   You may see Lorine Bears, MD or one of the following Advanced Practice Providers on your designated Care Team:    Nicolasa Ducking, NP  Eula Listen, PA-C  Marisue Ivan, PA-C  Cadence [place], [place]  Gillian Shields, NP    Other Instructions N/A

## 2020-12-03 NOTE — Progress Notes (Signed)
Cardiology Office Note   Date:  12/03/2020   ID:  Gavin Johnson, DOB [birth year], MRN [id number]  PCP:  Danelle Berry, PA-C  Cardiologist:   Lorine Bears, MD   Chief Complaint  Patient presents with  . Follow-up    Palpitations      History of Present Illness: Gavin Johnson is a 25 y.o. male who presents for a follow-up visit regarding PVCs and mildly dilated aortic root.  His father is one of my patient.  He had ascending aortic aneurysm that required surgical repair at the age of 25.  Grandfather also had aortic aneurysm but did not require intervention. The patient had previous echocardiogram in 2016 which showed normal LV systolic function with mildly dilated ascending aorta at 4 cm. He was seen by Eula Listen in 2019 for palpitations.  Echocardiogram was repeated which showed stable size of the aortic root at 3.8 cm.  Ascending aorta was 3.9 cm.  Outpatient monitor showed 6 beat run of ventricular tachycardia with 1 short run of SVT and rare PACs and PVCs.  The patient reports intermittent palpitations described as skipping but no tachycardia.  No dizziness, syncope or presyncope.  He has pain under the left axilla that happens at rest and not with exertion.  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    Past Medical History:  Diagnosis Date  . Celiac disease   . Dilated aortic root (HCC) 01/17/2018  . TMJPDS (temporomandibular joint pain dysfunction syndrome) 03/11/2020    Past Surgical History:  Procedure Laterality Date  . TONSILLECTOMY       Current Outpatient Medications  Medication Sig Dispense Refill  . busPIRone (BUSPAR) 10 MG tablet Take 0.5-1 tablets (5-10 mg total) by mouth 2 (two) times daily as needed. 180 tablet 3   No current facility-administered medications for this visit.    Allergies:   Amoxil [amoxicillin]    Social History:  The patient  reports that he has never smoked. His smokeless tobacco use includes chew. He  reports current alcohol use. He reports that he does not use drugs.   Family History:  The patient's family history includes Aortic aneurysm in his father; Arthritis in his mother; Cancer in his maternal grandfather.    ROS:  Please see the history of present illness.   Otherwise, review of systems are positive for none.   All other systems are reviewed and negative.    PHYSICAL EXAM: VS:  BP (!) 130/94   Pulse 84   Ht 6\' 2"  (1.88 m)   Wt 227 lb (103 kg)   BMI 29.15 kg/m  , BMI Body mass index is 29.15 kg/m. GEN: Well nourished, well developed, in no acute distress  HEENT: normal  Neck: no JVD, carotid bruits, or masses Cardiac: RRR; no murmurs, rubs, or gallops,no edema  Respiratory:  clear to auscultation bilaterally, normal work of breathing GI: soft, nontender, nondistended, + BS MS: no deformity or atrophy  Skin: warm and dry, no rash Neuro:  Strength and sensation are intact Psych: euthymic mood, full affect   EKG:  EKG is ordered today. The ekg ordered today demonstrates normal sinus rhythm with no significant ST or T wave changes.   Recent Labs: No results found for requested labs within last 8760 hours.    Lipid Panel No results found for: CHOL, TRIG, HDL, CHOLHDL, VLDL, LDLCALC, LDLDIRECT    Wt Readings from Last 3 Encounters:  12/03/20 227 lb (103  kg)  11/25/20 229 lb 4.8 oz (104 kg)  03/11/20 226 lb 6.4 oz (102.7 kg)        PAD Screen 01/17/2018  Previous PAD dx? No  Previous surgical procedure? No  Pain with walking? No  Feet/toe relief with dangling? No  Painful, non-healing ulcers? No  Extremities discolored? No      ASSESSMENT AND PLAN:  1.  Palpitations: He describes skipping in his heart without tachycardia.  Symptoms are suggestive of premature beats.  Cardiac exam is unremarkable and baseline EKG is normal.  I reassured him.  He is to let us know if his symptoms worsen.  He had a previous outpatient monitor and I do not think we need to  repeat that for now unless his symptoms worsen.  2.  Mildly dilated aortic root and ascending aorta: The maximum diameter on previous echo was 40 mm.  I do think we have to evaluate the whole ascending aorta which is better done with a CT scan.  I ordered a CTA of the chest.  The patient has family history of aortic aneurysm. His job unfortunately requires heavy lifting and will have to see what the size of his aorta with CT and determine if he needs to look for another job.  We have to continue monitoring blood pressure closely.    Disposition:   FU with me in 1 year  Signed,  Lorine Bears, MD  12/03/2020 4:08 PM    [facility]

## 2020-12-17 ENCOUNTER — Other Ambulatory Visit: Payer: Self-pay

## 2020-12-17 ENCOUNTER — Ambulatory Visit
Admission: RE | Admit: 2020-12-17 | Discharge: 2020-12-17 | Disposition: A | Discharge status: Home / Self Care | Payer: BC Managed Care – PPO | Source: Ambulatory Visit | Attending: Cardiovascular Disease | Admitting: Cardiovascular Disease

## 2020-12-17 DIAGNOSIS — I712 Thoracic aortic aneurysm, without rupture: Secondary | ICD-10-CM | POA: Diagnosis not present

## 2020-12-17 DIAGNOSIS — I2699 Other pulmonary embolism without acute cor pulmonale: Secondary | ICD-10-CM | POA: Diagnosis not present

## 2020-12-17 DIAGNOSIS — I77810 Thoracic aortic ectasia: Secondary | ICD-10-CM | POA: Insufficient documentation

## 2020-12-17 MED ORDER — IOHEXOL 350 MG/ML SOLN
75.0000 mL | Freq: Once | INTRAVENOUS | Status: AC | PRN
  Administered 2020-12-17: 75 mL via INTRAVENOUS

## 2020-12-23 ENCOUNTER — Telehealth: Payer: Self-pay

## 2020-12-23 NOTE — Telephone Encounter (Signed)
Patient returning call.

## 2020-12-23 NOTE — Telephone Encounter (Signed)
-----   Message from Iran Ouch, MD sent at 12/23/2020 10:15 AM EST ----- His aorta is in the upper limit of normal.  We do not need to repeat the CT next year and likely can wait a few years before repeating the test.

## 2020-12-23 NOTE — Telephone Encounter (Signed)
Called to give the patient CT results and Dr. Jari Sportsman recommendation. [email or web address].

## 2020-12-23 NOTE — Telephone Encounter (Signed)
Patient made aware of CT results with verbalized understanding.

## 2021-05-27 ENCOUNTER — Ambulatory Visit (INDEPENDENT_AMBULATORY_CARE_PROVIDER_SITE_OTHER): Payer: BC Managed Care – PPO | Admitting: Family Medicine

## 2021-05-27 ENCOUNTER — Encounter: Payer: Self-pay | Admitting: Family Medicine

## 2021-05-27 ENCOUNTER — Other Ambulatory Visit: Payer: Self-pay

## 2021-05-27 VITALS — BP 118/72 | HR 93 | Temp 98.3°F | Resp 16 | Ht 74.0 in | Wt 212.0 lb

## 2021-05-27 DIAGNOSIS — Z5181 Encounter for therapeutic drug level monitoring: Secondary | ICD-10-CM | POA: Diagnosis not present

## 2021-05-27 DIAGNOSIS — Z13220 Encounter for screening for lipoid disorders: Secondary | ICD-10-CM

## 2021-05-27 DIAGNOSIS — Z1159 Encounter for screening for other viral diseases: Secondary | ICD-10-CM | POA: Diagnosis not present

## 2021-05-27 DIAGNOSIS — Z0000 Encounter for general adult medical examination without abnormal findings: Secondary | ICD-10-CM

## 2021-05-27 DIAGNOSIS — E663 Overweight: Secondary | ICD-10-CM

## 2021-05-27 DIAGNOSIS — K900 Celiac disease: Secondary | ICD-10-CM | POA: Diagnosis not present

## 2021-05-27 DIAGNOSIS — R002 Palpitations: Secondary | ICD-10-CM

## 2021-05-27 DIAGNOSIS — Z136 Encounter for screening for cardiovascular disorders: Secondary | ICD-10-CM | POA: Diagnosis not present

## 2021-05-27 DIAGNOSIS — I77810 Thoracic aortic ectasia: Secondary | ICD-10-CM

## 2021-05-27 DIAGNOSIS — F411 Generalized anxiety disorder: Secondary | ICD-10-CM

## 2021-05-27 MED ORDER — BUSPIRONE HCL 10 MG PO TABS: 5.0000 mg | ORAL_TABLET | Freq: Two times a day (BID) | ORAL | 3 refills | Status: DC | PRN

## 2021-05-27 NOTE — Progress Notes (Signed)
Patient: Gavin Johnson, Male    DOB: [birth year], 25 y.o.   MRN: [id number] Delsa Grana, PA-C Visit Date: 05/27/2021  Today's Provider: Delsa Grana, PA-C   Chief Complaint  Patient presents with   Annual Exam   Subjective:   Annual physical exam:  Gavin Johnson is a 25 y.o. male who presents today for health maintenance and annual & complete physical exam.   Exercise/Activity:  physical labor with work Diet/nutrition:  cleaned up diet significantly  Sleep:  sleeps well  BP borderline for age with last documented BP - today BP in optimal/normal, he states he was nervous at cardiology appt BP Readings from Last 3 Encounters:  05/27/21 118/72  12/03/20 (!) 130/94  11/25/20 118/72    Consult/f/up with cardiology reviewed, CT angio chest aorta, ECG and f/up plan with Cardiology - they did not feel they needed to retest next year with imaging or holter - unless pt develops worse sx Pt notes mostly resolved sx, thinks some of it may have been acid reflux and he has since lost some weight   USPSTF grade A and B recommendations - reviewed and addressed today  Depression:  Phq 9 completed today by patient, was reviewed by me with patient in the room, score is  negative, pt feels good PHQ 2/9 Scores 05/27/2021 11/25/2020 03/11/2020 02/21/2019  PHQ - 2 Score 0 0 0 0  PHQ- 9 Score 0 3 0 0   Depression screen [facility] 2/9 05/27/2021 11/25/2020 03/11/2020 02/21/2019 01/09/2019  Decreased Interest 0 0 0 0 0  Down, Depressed, Hopeless 0 0 0 0 0  PHQ - 2 Score 0 0 0 0 0  Altered sleeping 0 2 0 0 0  Tired, decreased energy 0 0 0 0 0  Change in appetite 0 0 0 0 0  Feeling bad or failure about yourself  0 0 0 0 0  Trouble concentrating 0 1 0 0 0  Moving slowly or fidgety/restless 0 0 0 0 0  Suicidal thoughts 0 0 0 0 0  PHQ-9 Score 0 3 0 0 0  Difficult doing work/chores Not difficult at all Not difficult at all Not difficult at all Not difficult at all Not difficult at all   Hx of anxiety  previously on daily meds but didn't like, currently on buspar, feels that he is still managing his sx well GAD 7 : Generalized Anxiety Score 11/25/2020 02/21/2019 01/09/2019  Nervous, Anxious, on Edge '1 1 1  ' Control/stop worrying '1 1 1  ' Worry too much - different things 1 0 0  Trouble relaxing 0 1 1  Restless 1 0 1  Easily annoyed or irritable '1 2 2  ' Afraid - awful might happen 1 0 0  Total GAD 7 Score '6 5 6  ' Anxiety Difficulty Not difficult at all Not difficult at all Somewhat difficult     Hep C Screening: due STD testing and prevention (HIV/chl/gon/syphilis): done in the past - HIV done int he past no new partner  Intimate partner violence: safe, no abuse  Prostate cancer: n/a per age and family hx  Advanced Care Planning:  A voluntary discussion about advance care planning including the explanation and discussion of advance directives.  Discussed health care proxy and Living will, and the patient was able to identify a health care proxy as future wife - .  Patient does not have a living will at present time. If patient does have living will, I have requested they bring this  to the clinic to be scanned in to their chart.  Health Maintenance  Topic Date Due   Hepatitis C Screening  Never done   INFLUENZA VACCINE  05/24/2021   COVID-19 Vaccine (1) 06/12/2021 (Originally 05/06/1999)   TETANUS/TDAP  02/19/2026   HIV Screening  Completed   Pneumococcal Vaccine 56-7 Years old  Aged Out   HPV VACCINES  Aged Out    Skin cancer:  Pt reports no hx of skin cancer, suspicious lesions/biopsies in the past.  Colorectal cancer:  colonoscopy is not due per age   Pt denies   Lung cancer:   Low Dose CT Chest recommended if Age 44-80 years, 20 pack-year currently smoking OR have quit w/in 15years. Patient does not qualify.   Social History   Tobacco Use   Smoking status: Never   Smokeless tobacco: Current    Types: Chew  Substance Use Topics   Alcohol use: Yes    Comment: 2-3x weekly      Alcohol screening: [facility] Visit from 11/25/2020 in [facility]  AUDIT-C Score 0      AAA: n/a  The USPSTF recommends one-time screening with ultrasonography in men ages 1 to 27 years who have ever smoked  ECG:  reviewed last ecg with cardiology  - none needed today   Blood pressure/Hypertension: BP Readings from Last 3 Encounters:  05/27/21 118/72  12/03/20 (!) 130/94  11/25/20 118/72   Weight/Obesity: Wt Readings from Last 6 Encounters:  05/27/21 212 lb (96.2 kg)  12/03/20 227 lb (103 kg)  11/25/20 229 lb 4.8 oz (104 kg)  03/11/20 226 lb 6.4 oz (102.7 kg)  01/17/18 219 lb 12 oz (99.7 kg)  11/17/17 218 lb 9.6 oz (99.2 kg)   BMI Readings from Last 3 Encounters:  05/27/21 27.22 kg/m  12/03/20 29.15 kg/m  11/25/20 29.44 kg/m    Lipids:  No results found for: CHOL No results found for: HDL No results found for: LDLCALC No results found for: TRIG No results found for: CHOLHDL No results found for: LDLDIRECT Based on the results of lipid panel his/her cardiovascular risk factor ( using [place] )  in the next 10 years is : The ASCVD Risk score Mikey Bussing DC Jr., et al., 2013) failed to calculate for the following reasons:   The 2013 ASCVD risk score is only valid for ages 21 to 90 Glucose:  Glucose  Date Value Ref Range Status  01/17/2018 84 65 - 99 mg/dL Final   Glucose, Bld  Date Value Ref Range Status  12/03/2020 78 70 - 99 mg/dL Final    Comment:    Glucose reference range applies only to samples taken after fasting for at least 8 hours.    Social History      He  reports that he has never smoked. His smokeless tobacco use includes chew. He reports current alcohol use. He reports that he does not use drugs.       Social History   Socioeconomic History   Marital status: Single    Spouse name: Not on file   Number of children: Not on file   Years of education: Not on file   Highest education level: Not on file   Occupational History   Not on file  Tobacco Use   Smoking status: Never   Smokeless tobacco: Current    Types: Chew  Substance and Sexual Activity   Alcohol use: Yes    Comment: 2-3x weekly   Drug use:  No   Sexual activity: Yes  Other Topics Concern   Not on file  Social History Narrative   Not on file   Social Determinants of Health   Financial Resource Strain: Low Risk    Difficulty of Paying Living Expenses: Not hard at all  Food Insecurity: No Food Insecurity   Worried About Charity fundraiser in the Last Year: Never true   [place] in the Last Year: Never true  Transportation Needs: No Transportation Needs   Lack of Transportation (Medical): No   Lack of Transportation (Non-Medical): No  Physical Activity: Sufficiently Active   Days of Exercise per Week: 6 days   Minutes of Exercise per Session: 40 min  Stress: No Stress Concern Present   Feeling of Stress : Only a little  Social Connections: Moderately Isolated   Frequency of Communication with Friends and Family: More than three times a week   Frequency of Social Gatherings with Friends and Family: Twice a week   Attends Religious Services: Never   Marine scientist or Organizations: No   Attends Music therapist: Never   Marital Status: Living with partner   Family History        Family Status  Relation Name Status   Mother  Alive   Father  Alive   MGF  (Not Specified)   Neg Hx  (Not Specified)        His family history includes Aortic aneurysm in his father; Arthritis in his mother; Cancer in his maternal grandfather. There is no history of Diabetes.       Family History  Problem Relation Age of Onset   Arthritis Mother    Aortic aneurysm Father    Cancer Maternal Grandfather        colon   Diabetes Neg Hx     Patient Active Problem List   Diagnosis Date Noted   Palpitations 11/25/2020   GAD (generalized anxiety disorder) 03/11/2020   Dilated aortic root ([place])  01/17/2018   Family hx of aortic aneurysm 07/01/2015   Celiac disease    Past Surgical History:  Procedure Laterality Date   TONSILLECTOMY      Current Outpatient Medications:    busPIRone (BUSPAR) 10 MG tablet, Take 0.5-1 tablets (5-10 mg total) by mouth 2 (two) times daily as needed., Disp: 180 tablet, Rfl: 3  Allergies  Allergen Reactions   Amoxil [Amoxicillin] Rash    Patient Care Team: Delsa Grana, PA-C as PCP - General (Family Medicine)   Chart Review: I personally reviewed active problem list, medication list, allergies, family history, social history, health maintenance, notes from last encounter, lab results, imaging with the patient/caregiver today.   Review of Systems  Constitutional: Negative.   HENT: Negative.    Eyes: Negative.   Respiratory: Negative.    Cardiovascular: Negative.   Gastrointestinal: Negative.   Endocrine: Negative.   Genitourinary: Negative.   Musculoskeletal: Negative.   Skin: Negative.   Allergic/Immunologic: Negative.   Neurological: Negative.   Hematological: Negative.   Psychiatric/Behavioral: Negative.    All other systems reviewed and are negative.        Objective:   Vitals:  Vitals:   05/27/21 1450  BP: 118/72  Pulse: 93  Resp: 16  Temp: 98.3 F (36.8 C)  SpO2: 96%  Weight: 212 lb (96.2 kg)  Height: '6\' 2"'  (1.88 m)    Body mass index is 27.22 kg/m.  Physical Exam Vitals and nursing note reviewed.  Constitutional:      General: He is not in acute distress.    Appearance: Normal appearance. He is well-developed and normal weight. He is not ill-appearing, toxic-appearing or diaphoretic.  HENT:     Head: Normocephalic and atraumatic.     Jaw: No trismus.     Right Ear: Tympanic membrane, ear canal and external ear normal. There is no impacted cerumen.     Left Ear: Tympanic membrane, ear canal and external ear normal. There is no impacted cerumen.     Nose: Mucosal edema, congestion and rhinorrhea present.      Right Sinus: No maxillary sinus tenderness or frontal sinus tenderness.     Left Sinus: No maxillary sinus tenderness or frontal sinus tenderness.     Mouth/Throat:     Mouth: Mucous membranes are moist. Mucous membranes are not pale and not cyanotic.     Pharynx: Oropharynx is clear. Uvula midline. Posterior oropharyngeal erythema present. No oropharyngeal exudate or uvula swelling.     Tonsils: No tonsillar exudate or tonsillar abscesses.  Eyes:     General: Lids are normal.        Right eye: No discharge.        Left eye: No discharge.     Conjunctiva/sclera: Conjunctivae normal.     Pupils: Pupils are equal, round, and reactive to light.  Neck:     Trachea: Trachea and phonation normal. No tracheal deviation.  Cardiovascular:     Rate and Rhythm: Normal rate and regular rhythm.     Pulses: Normal pulses.          Radial pulses are 2+ on the right side and 2+ on the left side.     Heart sounds: Normal heart sounds. No murmur heard.   No friction rub. No gallop.  Pulmonary:     Effort: Pulmonary effort is normal. No tachypnea, accessory muscle usage or respiratory distress.     Breath sounds: Normal breath sounds. No stridor. No decreased breath sounds, wheezing, rhonchi or rales.  Abdominal:     General: Bowel sounds are normal. There is no distension.     Palpations: Abdomen is soft.     Tenderness: There is no abdominal tenderness.  Musculoskeletal:     Cervical back: Normal range of motion and neck supple.     Right lower leg: No edema.     Left lower leg: No edema.  Skin:    General: Skin is warm and dry.     Capillary Refill: Capillary refill takes less than 2 seconds.     Coloration: Skin is not pale.     Findings: No rash.     Nails: There is no clubbing.  Neurological:     Mental Status: He is alert and oriented to person, place, and time.     Motor: No abnormal muscle tone.     Coordination: Coordination normal.     Gait: Gait normal.  Psychiatric:        Mood  and Affect: Mood normal.        Speech: Speech normal.        Behavior: Behavior normal. Behavior is cooperative.     No results found for this or any previous visit (from the past 2160 hour(s)).  Fall Risk: Fall Risk  05/27/2021 11/25/2020 03/11/2020 02/21/2019 01/09/2019  Falls in the past year? 0 0 0 0 0  Number falls in past yr: 0 0 0 0 0  Injury with Fall? 0 0 0 0  0  Follow up - Falls evaluation completed - Falls evaluation completed Falls evaluation completed    Functional Status Survey: Is the patient deaf or have difficulty hearing?: No Does the patient have difficulty seeing, even when wearing glasses/contacts?: No Does the patient have difficulty concentrating, remembering, or making decisions?: No Does the patient have difficulty walking or climbing stairs?: No Does the patient have difficulty dressing or bathing?: No Does the patient have difficulty doing errands alone such as visiting a doctor's office or shopping?: No   Assessment & Plan:    CPE completed today  Prostate cancer screening and PSA options (with potential risks and benefits of testing vs not testing) were discussed along with recent recs/guidelines, shared decision making and handout/information given to pt today  USPSTF grade A and B recommendations reviewed with patient; age-appropriate recommendations, preventive care, screening tests, etc discussed and encouraged; healthy living encouraged; see AVS for patient education given to patient  Discussed importance of 150 minutes of physical activity weekly, AHA exercise recommendations given to pt in AVS/handout  Discussed importance of healthy diet:  eating lean meats and proteins, avoiding trans fats and saturated fats, avoid simple sugars and excessive carbs in diet, eat 6 servings of fruit/vegetables daily and drink plenty of water and avoid sweet beverages.  DASH diet reviewed if pt has HTN  Recommended pt to do annual eye exam and routine dental  exams/cleanings  Reviewed Health Maintenance: Health Maintenance  Topic Date Due   Hepatitis C Screening  Never done   INFLUENZA VACCINE  05/24/2021   COVID-19 Vaccine (1) 06/12/2021 (Originally 05/06/1999)   TETANUS/TDAP  02/19/2026   HIV Screening  Completed   Pneumococcal Vaccine 22-10 Years old  Aged Out   HPV VACCINES  Aged Out    Immunizations: Immunization History  Administered Date(s) Administered   DTaP 07/06/1994, 08/31/1994, 11/09/1994, 05/07/1999   Hepatitis B 1993/11[birth year]28, 07/06/1994, 11/09/1994   IPV 07/06/1994, 08/31/1994, 11/09/1994, 05/07/1999   MMR 05/07/1999   Tdap 03/12/2009, 02/20/2016     ICD-10-CM   1. Adult general medical exam  Z00.00 CBC with Differential/Platelet    COMPLETE METABOLIC PANEL WITH GFR    Lipid panel    TSH    Hepatitis C antibody    2. Dilated aortic root (HCC)  I77.810 Lipid panel   reviewed cardiology work up, results, recommendations, will check in with cardiology every few years    3. Celiac disease  K90.0 CBC with Differential/Platelet    COMPLETE METABOLIC PANEL WITH GFR   managed with diet    4. Palpitations  R00.2 CBC with Differential/Platelet    COMPLETE METABOLIC PANEL WITH GFR    TSH   no longer having often palpitations, ECG was normal sinus, cardiology suspected PVCs    5. Encounter for medication monitoring  Z51.81 CBC with Differential/Platelet    COMPLETE METABOLIC PANEL WITH GFR    Lipid panel    6. Encounter for hepatitis C screening test for low risk patient  Z11.59 Hepatitis C antibody    7. Encounter for lipid screening for cardiovascular disease  K46.286 COMPLETE METABOLIC PANEL WITH GFR   Z13.6 Lipid panel    8. Overweight (BMI 25.0-29.9)  N81.7 COMPLETE METABOLIC PANEL WITH GFR    Lipid panel    TSH    9. GAD (generalized anxiety disorder)  F41.1 busPIRone (BUSPAR) 10 MG tablet   Well-controlled with BuSpar and coping skills, continue meds, offered resources or f/up for if needed  Delsa Grana, PA-C 05/27/21 3:25 PM  [facility]

## 2021-05-27 NOTE — Patient Instructions (Addendum)
Allergic Rhinitis, Adult Allergic rhinitis is a reaction to allergens. Allergens are things that can cause an allergic reaction. This condition affects the lining inside the nose (mucous membrane). There are two types of allergic rhinitis: Seasonal. This type is also called hay fever. It happens only during some times of the year. Perennial. This type can happen at any time of the year. This condition cannot be spread from person to person (is not contagious). It can be mild, worse, or very bad. It can develop at any age and may beoutgrown. What are the causes? This condition may be caused by: Pollen from grasses, trees, and weeds. Dust mites. Smoke. Mold. Car fumes. The pee (urine), spit, or dander of pets. Dander is dead skin cells from a pet. What increases the risk? You are more likely to develop this condition if: You have allergies in your family. You have problems like allergies in your family. You may have: Swelling of parts of your eyes and eyelids. Asthma. This affects how you breathe. Long-term redness and swelling on your skin. Food allergies. What are the signs or symptoms? The main symptom of this condition is a runny or stuffy nose (nasal congestion). Other symptoms may include: Sneezing or coughing. Itching and tearing of your eyes. Mucus that drips down the back of your throat (postnasal drip). Trouble sleeping. Feeling tired. Headache. Sore throat. How is this treated? There is no cure for this condition. You should avoid things that you are allergic to. Treatment can help to relieve symptoms. This may include: Medicines that block allergy symptoms, such as corticosteroids or antihistamines. These may be given as a shot, nasal spray, or pill. Avoiding things you are allergic to. Medicines that give you bits of what you are allergic to over time. This is called immunotherapy. It is done if other treatments do not help. You may get: Shots. Medicine under your  tongue. Stronger medicines, if other treatments do not help. Follow these instructions at home: Avoiding allergens Find out what things you are allergic to and avoid them. To do this, try these things: If you get allergies any time of year: Replace carpet with wood, tile, or vinyl flooring. Carpet can trap pet dander and dust. Do not smoke. Do not allow smoking in your home. Change your heating and air conditioning filters at least once a month. If you get allergies only some times of the year: Keep windows closed when you can. Plan things to do outside when pollen counts are lowest. Check pollen counts before you plan things to do outside. When you come indoors, change your clothes and shower before you sit on furniture or bedding. If you are allergic to a pet: Keep the pet out of your bedroom. Vacuum, sweep, and dust often.  General instructions Take over-the-counter and prescription medicines only as told by your doctor. Drink enough fluid to keep your pee (urine) pale yellow. Keep all follow-up visits as told by your doctor. This is important. Where to find more information American Academy of Allergy, Asthma & Immunology: www.aaaai.org Contact a doctor if: You have a fever. You get a cough that does not go away. You make whistling sounds when you breathe (wheeze). Your symptoms slow you down. Your symptoms stop you from doing your normal things each day. Get help right away if: You are short of breath. This symptom may be an emergency. Do not wait to see if the symptom will go away. Get medical help right away. Call your local emergency  services (911 in the [place] Do not drive yourself to the hospital. Summary Allergic rhinitis may be treated by taking medicines and avoiding things you are allergic to. If you have allergies only some of the year, keep windows closed when you can at those times. Contact your doctor if you get a fever or a cough that does not go away. This  information is not intended to replace advice given to you by your health care provider. Make sure you discuss any questions you have with your healthcare provider. Document Revised: 12/02/2019 Document Reviewed: 10/08/2019 Elsevier Patient Education  2022 [employer].   Preventive Care 85-17 Years Old, Male Preventive care refers to lifestyle choices and visits with your health care provider that can promote health and wellness. This includes: A yearly physical exam. This is also called an annual wellness visit. Regular dental and eye exams. Immunizations. Screening for certain conditions. Healthy lifestyle choices, such as: Eating a healthy diet. Getting regular exercise. Not using drugs or products that contain nicotine and tobacco. Limiting alcohol use. What can I expect for my preventive care visit? Physical exam Your health care provider may check your: Height and weight. These may be used to calculate your BMI (body mass index). BMI is a measurement that tells if you are at a healthy weight. Heart rate and blood pressure. Body temperature. Skin for abnormal spots. Counseling Your health care provider may ask you questions about your: Past medical problems. Family's medical history. Alcohol, tobacco, and drug use. Emotional well-being. Home life and relationship well-being. Sexual activity. Diet, exercise, and sleep habits. Work and work Astronomer. Access to firearms. What immunizations do I need?  Vaccines are usually given at various ages, according to a schedule. Your health care provider will recommend vaccines for you based on your age, medicalhistory, and lifestyle or other factors, such as travel or where you work. What tests do I need? Blood tests Lipid and cholesterol levels. These may be checked every 5 years starting at age 61. Hepatitis C test. Hepatitis B test. Screening  Diabetes screening. This is done by checking your blood sugar (glucose) after  you have not eaten for a while (fasting). Genital exam to check for testicular cancer or hernias. STD (sexually transmitted disease) testing, if you are at risk. Talk with your health care provider about your test results, treatment options,and if necessary, the need for more tests. Follow these instructions at home: Eating and drinking  Eat a healthy diet that includes fresh fruits and vegetables, whole grains, lean protein, and low-fat dairy products. Drink enough fluid to keep your urine pale yellow. Take vitamin and mineral supplements as recommended by your health care provider. Do not drink alcohol if your health care provider tells you not to drink. If you drink alcohol: Limit how much you have to 0-2 drinks a day. Be aware of how much alcohol is in your drink. In the [place] one drink equals one 12 oz bottle of beer (355 mL), one 5 oz glass of wine (148 mL), or one 1 oz glass of hard liquor (44 mL).  Lifestyle Take daily care of your teeth and gums. Brush your teeth every morning and night with fluoride toothpaste. Floss one time each day. Stay active. Exercise for at least 30 minutes 5 or more days each week. Do not use any products that contain nicotine or tobacco, such as cigarettes, e-cigarettes, and chewing tobacco. If you need help quitting, ask your health care provider. Do not use drugs. If  you are sexually active, practice safe sex. Use a condom or other form of protection to prevent STIs (sexually transmitted infections). Find healthy ways to cope with stress, such as: Meditation, yoga, or listening to music. Journaling. Talking to a trusted person. Spending time with friends and family. Safety Always wear your seat belt while driving or riding in a vehicle. Do not drive: If you have been drinking alcohol. Do not ride with someone who has been drinking. When you are tired or distracted. While texting. Wear a helmet and other protective equipment during sports  activities. If you have firearms in your house, make sure you follow all gun safety procedures. Seek help if you have been physically or sexually abused. What's next? Go to your health care provider once a year for an annual wellness visit. Ask your health care provider how often you should have your eyes and teeth checked. Stay up to date on all vaccines. This information is not intended to replace advice given to you by your health care provider. Make sure you discuss any questions you have with your healthcare provider. Document Revised: 06/26/2019 Document Reviewed: 10/04/2018 Elsevier Patient Education  2022 [employer].

## 2021-05-28 LAB — COMPLETE METABOLIC PANEL WITH GFR
AG Ratio: 2 (calc) (ref 1.0–2.5)
ALT: 33 U/L (ref 9–46)
AST: 27 U/L (ref 10–40)
Albumin: 4.9 g/dL (ref 3.6–5.1)
Alkaline phosphatase (APISO): 60 U/L (ref 36–130)
BUN: 19 mg/dL (ref 7–25)
CO2: 25 mmol/L (ref 20–32)
Calcium: 9.5 mg/dL (ref 8.6–10.3)
Chloride: 102 mmol/L (ref 98–110)
Creat: 1.09 mg/dL (ref 0.60–1.24)
Globulin: 2.4 g/dL (calc) (ref 1.9–3.7)
Glucose, Bld: 88 mg/dL (ref 65–99)
Potassium: 4.3 mmol/L (ref 3.5–5.3)
Sodium: 137 mmol/L (ref 135–146)
Total Bilirubin: 1.5 mg/dL — ABNORMAL HIGH (ref 0.2–1.2)
Total Protein: 7.3 g/dL (ref 6.1–8.1)
eGFR: 95 mL/min/{1.73_m2} (ref >=60)

## 2021-05-28 LAB — CBC WITH DIFFERENTIAL/PLATELET
Absolute Monocytes: 576 cells/uL (ref 200–950)
Basophils Absolute: 19 cells/uL (ref 0–200)
Basophils Relative: 0.3 %
Eosinophils Absolute: 70 cells/uL (ref 15–500)
Eosinophils Relative: 1.1 %
HCT: 51.4 % — ABNORMAL HIGH (ref 38.5–50.0)
Hemoglobin: 17.1 g/dL (ref 13.2–17.1)
Lymphs Abs: 2086 cells/uL (ref 850–3900)
MCH: 31.1 pg (ref 27.0–33.0)
MCHC: 33.3 g/dL (ref 32.0–36.0)
MCV: 93.5 fL (ref 80.0–100.0)
MPV: 9.9 fL (ref 7.5–12.5)
Monocytes Relative: 9 %
Neutro Abs: 3648 cells/uL (ref 1500–7800)
Neutrophils Relative %: 57 %
Platelets: 324 10*3/uL (ref 140–400)
RBC: 5.5 10*6/uL (ref 4.20–5.80)
RDW: 11.8 % (ref 11.0–15.0)
Total Lymphocyte: 32.6 %
WBC: 6.4 10*3/uL (ref 3.8–10.8)

## 2021-05-28 LAB — HEPATITIS C ANTIBODY
Hepatitis C Ab: NONREACTIVE
SIGNAL TO CUT-OFF: 0.01 (ref <1.00)

## 2021-05-28 LAB — LIPID PANEL
Cholesterol: 189 mg/dL (ref <200)
HDL: 63 mg/dL (ref >=40)
LDL Cholesterol (Calc): 109 mg/dL (calc) — ABNORMAL HIGH
Non-HDL Cholesterol (Calc): 126 mg/dL (calc) (ref <130)
Total CHOL/HDL Ratio: 3 (calc) (ref <5.0)
Triglycerides: 84 mg/dL (ref <150)

## 2021-05-28 LAB — TSH: TSH: 1.26 mIU/L (ref 0.40–4.50)

## 2021-08-04 IMAGING — CT CT ANGIO CHEST
2 of 6 series · 13 of 36 positions shown · IV contrast (omnipaque)
Comparison: None available

CLINICAL DATA: Thoracic aortic aneurysm, currently asymptomatic.

EXAM:
CT ANGIOGRAPHY CHEST WITH CONTRAST
TECHNIQUE: Multidetector CT imaging of the chest was performed using the
standard protocol during bolus administration of intravenous
contrast. Multiplanar CT image reconstructions and MIPs were
obtained to evaluate the vascular anatomy.
CONTRAST:  75mL OMNIPAQUE IOHEXOL 350 MG/ML SOLN

[Series 5: axial arterial cta thorax 2.00 · axial · arterial · 0.73mm/px · z∈[-1233,-957]mm · 12 of 164 slices shown]
[im 13/164  lung]
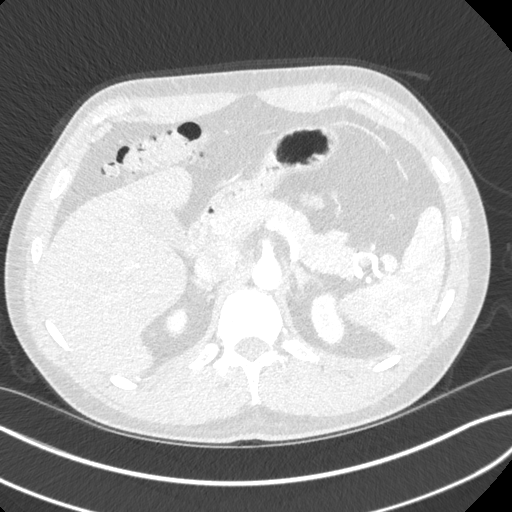
[im 26/164  mediastinal]
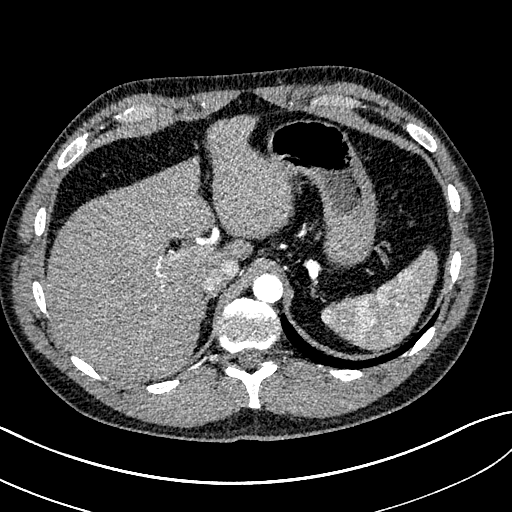
[im 38/164  lung]
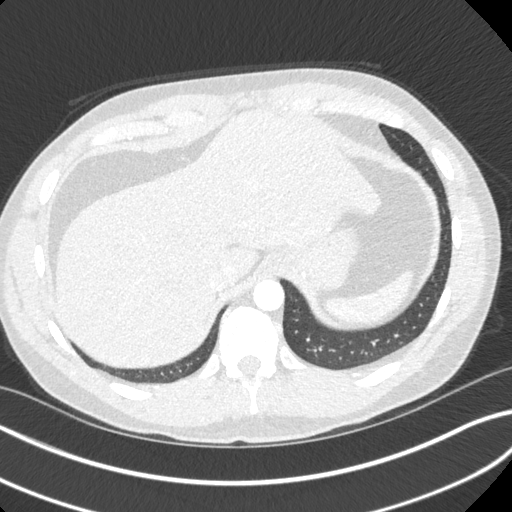
[im 51/164  mediastinal]
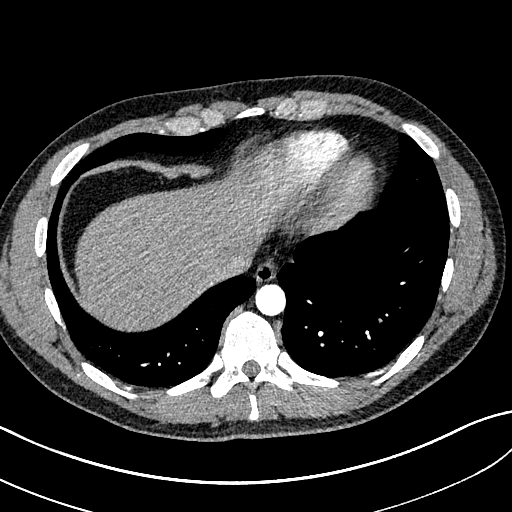
[im 63/164  lung]
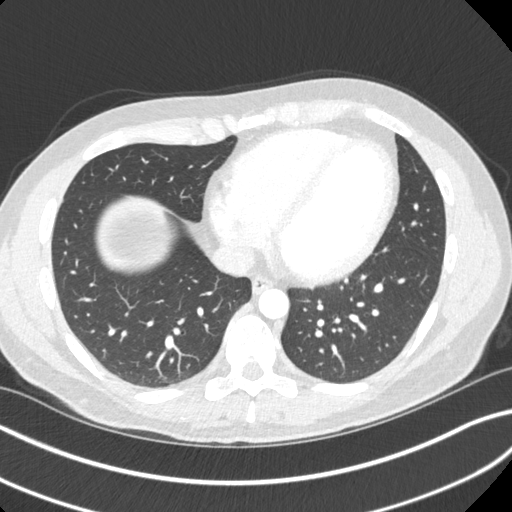
[im 76/164  mediastinal]
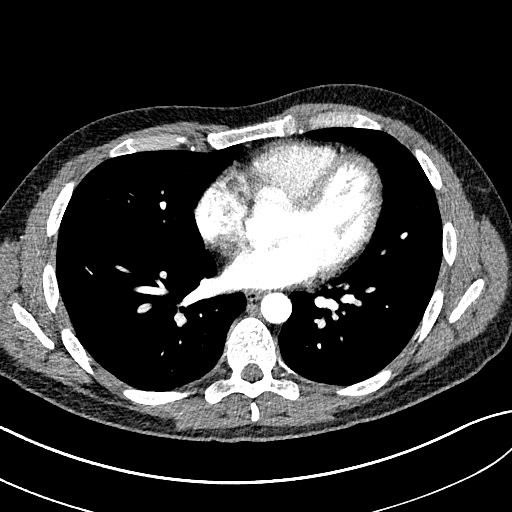
[im 88/164  lung]
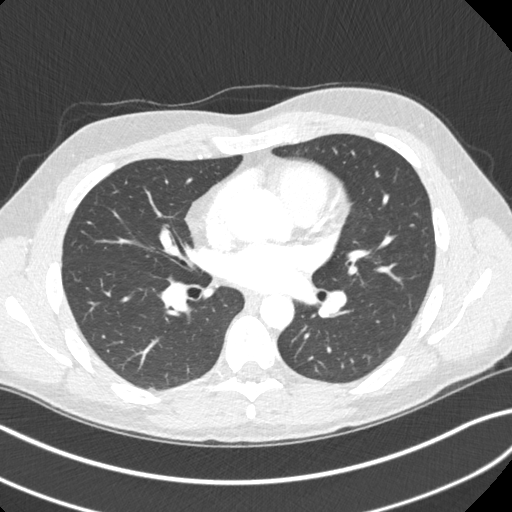
[im 101/164  mediastinal]
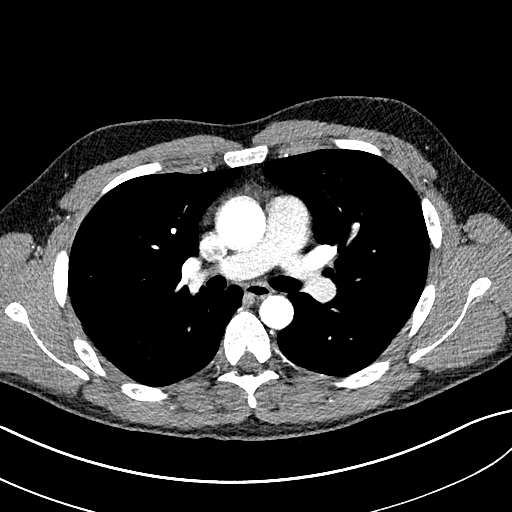
[im 113/164  lung]
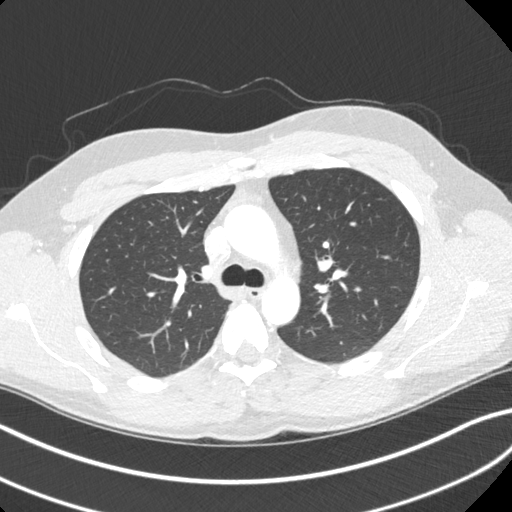
[im 126/164  mediastinal]
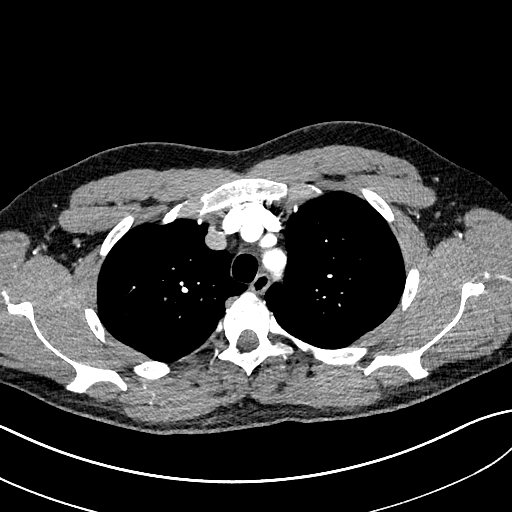
[im 138/164  lung]
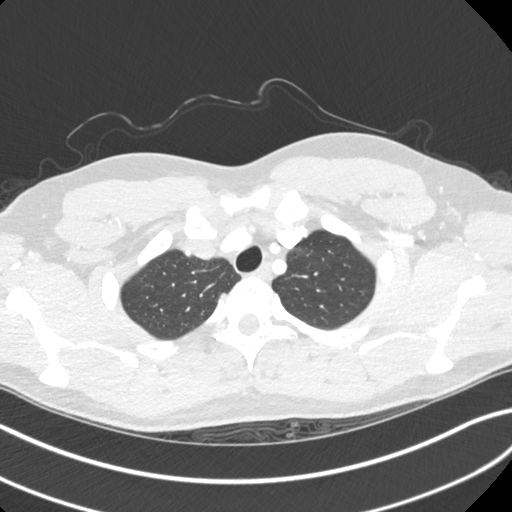
[im 151/164  mediastinal]
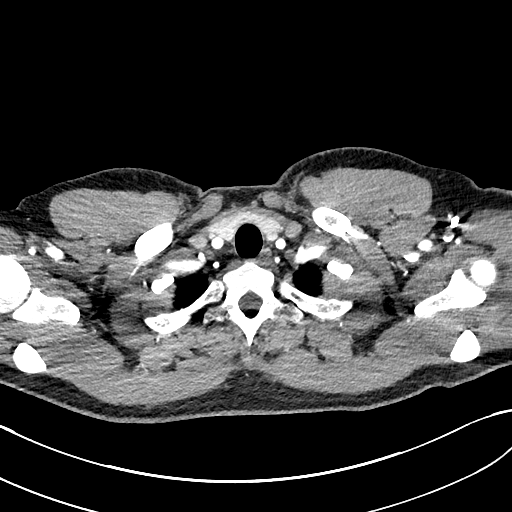

[Series 8: cor st cta thorax 2.00 cor · coronal · 0.64mm/px · 1 of 198 slices shown]
[im 99/198  mediastinal]
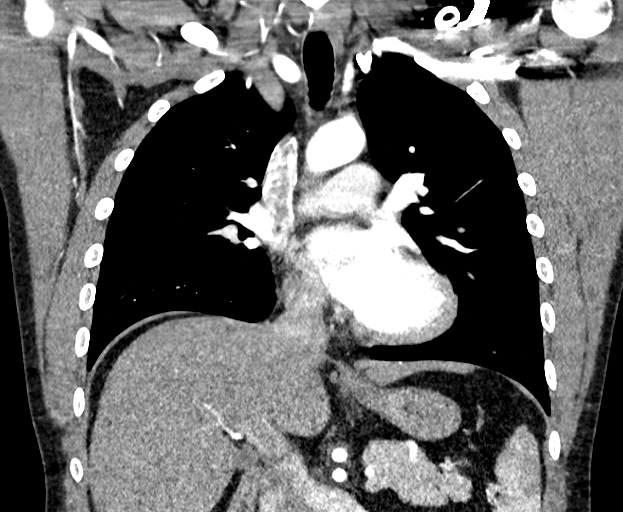

[13 of 36 positions shown; findings below may reference images not displayed]

FINDINGS: Cardiovascular: Heart size normal. No pericardial effusion.
Incomplete opacification of the pulmonary arterial tree; the exam
was not optimized for detection of pulmonary emboli. Good contrast
opacification of the thoracic aorta. No dissection or stenosis.

Aortic Root:

--Valve: 2.9 cm

--Sinuses: 4.2 cm

--Sinotubular Junction: 3.1 cm

Limitations by motion: Moderate

Thoracic Aorta:

--Ascending Aorta: 3.5 cm

--Aortic Arch: 2.7 cm

--Descending Aorta:

Classic 3 vessel brachiocephalic arterial origin anatomy without
proximal stenosis. Visualized proximal abdominal aorta unremarkable.

Mediastinum/Nodes: No mass or adenopathy.

Lungs/Pleura: No pleural effusion. No pneumothorax. Lungs are clear.

Upper Abdomen: No acute findings.

Musculoskeletal: No chest wall abnormality. No acute or significant
osseous findings.

Review of the MIP images confirms the above findings.
IMPRESSION: 1. No thoracic aortic aneurysm, dissection, or other acute findings.

## 2022-05-14 ENCOUNTER — Other Ambulatory Visit: Payer: Self-pay | Admitting: Family Medicine

## 2022-05-14 DIAGNOSIS — F411 Generalized anxiety disorder: Secondary | ICD-10-CM

## 2022-05-16 ENCOUNTER — Other Ambulatory Visit: Payer: Self-pay

## 2022-05-16 DIAGNOSIS — F411 Generalized anxiety disorder: Secondary | ICD-10-CM

## 2022-05-17 NOTE — Telephone Encounter (Signed)
Lvm to schedule an appt for med refill. Will not refill till appt.

## 2022-07-28 ENCOUNTER — Telehealth: Payer: BC Managed Care – PPO | Admitting: Physician Assistant

## 2022-07-28 DIAGNOSIS — J069 Acute upper respiratory infection, unspecified: Secondary | ICD-10-CM | POA: Diagnosis not present

## 2022-07-29 MED ORDER — IPRATROPIUM BROMIDE 0.03 % NA SOLN: 2.0000 | Freq: Two times a day (BID) | NASAL | 0 refills | Status: DC

## 2022-07-29 MED ORDER — BENZONATATE 100 MG PO CAPS: 100.0000 mg | ORAL_CAPSULE | Freq: Three times a day (TID) | ORAL | 0 refills | Status: DC | PRN

## 2022-07-29 MED ORDER — PROMETHAZINE-DM 6.25-15 MG/5ML PO SYRP: 5.0000 mL | ORAL_SOLUTION | Freq: Four times a day (QID) | ORAL | 0 refills | Status: DC | PRN

## 2022-07-29 NOTE — Progress Notes (Signed)
E-Visit for Upper Respiratory Infection   We are sorry you are not feeling well.  Here is how we plan to help!  Based on what you have shared with me, it looks like you may have a viral upper respiratory infection.  Upper respiratory infections are caused by a large number of viruses; however, rhinovirus is the most common cause.   Symptoms vary from person to person, with common symptoms including sore throat, cough, fatigue or lack of energy and feeling of general discomfort.  A low-grade fever of up to 100.4 may present, but is often uncommon.  Symptoms vary however, and are closely related to a person's age or underlying illnesses.  The most common symptoms associated with an upper respiratory infection are nasal discharge or congestion, cough, sneezing, headache and pressure in the ears and face.  These symptoms usually persist for about 3 to 10 days, but can last up to 2 weeks.  It is important to know that upper respiratory infections do not cause serious illness or complications in most cases.    Upper respiratory infections can be transmitted from person to person, with the most common method of transmission being a person's hands.  The virus is able to live on the skin and can infect other persons for up to 2 hours after direct contact.  Also, these can be transmitted when someone coughs or sneezes; thus, it is important to cover the mouth to reduce this risk.  To keep the spread of the illness at [place], good hand hygiene is very important.  This is an infection that is most likely caused by a virus. There are no specific treatments other than to help you with the symptoms until the infection runs its course.  We are sorry you are not feeling well.  Here is how we plan to help!   For nasal congestion, you may use an oral decongestants such as Mucinex D or if you have glaucoma or high blood pressure use plain Mucinex.  Saline nasal spray or nasal drops can help and can safely be used as often as  needed for congestion.  For your congestion, I have prescribed Ipratropium Bromide nasal spray 0.03% two sprays in each nostril 2-3 times a day  If you do not have a history of heart disease, hypertension, diabetes or thyroid disease, prostate/bladder issues or glaucoma, you may also use Sudafed to treat nasal congestion.  It is highly recommended that you consult with a pharmacist or your primary care physician to ensure this medication is safe for you to take.     If you have a cough, you may use cough suppressants such as Delsym and Robitussin.  If you have glaucoma or high blood pressure, you can also use Coricidin HBP.   For cough I have prescribed for you A prescription cough medication called Tessalon Perles 100 mg. You may take 1-2 capsules every 8 hours as needed for cough and Promethazine DM Take 55mL every 6 hours as needed for cough. Can be used together with Tessalon perles (Benzonatate).  If you have a sore or scratchy throat, use a saltwater gargle-  to  teaspoon of salt dissolved in a 4-ounce to 8-ounce glass of warm water.  Gargle the solution for approximately 15-30 seconds and then spit.  It is important not to swallow the solution.  You can also use throat lozenges/cough drops and Chloraseptic spray to help with throat pain or discomfort.  Warm or cold liquids can also be helpful in  relieving throat pain.  For headache, pain or general discomfort, you can use Ibuprofen or Tylenol as directed.   Some authorities believe that zinc sprays or the use of Echinacea may shorten the course of your symptoms.   HOME CARE Only take medications as instructed by your medical team. Be sure to drink plenty of fluids. Water is fine as well as fruit juices, sodas and electrolyte beverages. You may want to stay away from caffeine or alcohol. If you are nauseated, try taking small sips of liquids. How do you know if you are getting enough fluid? Your urine should be a pale yellow or almost  colorless. Get rest. Taking a steamy shower or using a humidifier may help nasal congestion and ease sore throat pain. You can place a towel over your head and breathe in the steam from hot water coming from a faucet. Using a saline nasal spray works much the same way. Cough drops, hard candies and sore throat lozenges may ease your cough. Avoid close contacts especially the very young and the elderly Cover your mouth if you cough or sneeze Always remember to wash your hands.   GET HELP RIGHT AWAY IF: You develop worsening fever. If your symptoms do not improve within 10 days You develop yellow or green discharge from your nose over 3 days. You have coughing fits You develop a severe head ache or visual changes. You develop shortness of breath, difficulty breathing or start having chest pain Your symptoms persist after you have completed your treatment plan  MAKE SURE YOU  Understand these instructions. Will watch your condition. Will get help right away if you are not doing well or get worse.  Thank you for choosing an e-visit.  Your e-visit answers were reviewed by a board certified advanced clinical practitioner to complete your personal care plan. Depending upon the condition, your plan could have included both over the counter or prescription medications.  Please review your pharmacy choice. Make sure the pharmacy is open so you can pick up prescription now. If there is a problem, you may contact your provider through [employer] and have the prescription routed to another pharmacy.  Your safety is important to [place]. If you have drug allergies check your prescription carefully.   For the next 24 hours you can use MyChart to ask questions about today's visit, request a non-urgent call back, or ask for a work or school excuse. You will get an email in the next two days asking about your experience. I hope that your e-visit has been valuable and will speed your recovery.   I  provided 5 minutes of non face-to-face time during this encounter for chart review and documentation.

## 2022-08-30 ENCOUNTER — Ambulatory Visit: Payer: BC Managed Care – PPO | Admitting: Family Medicine

## 2022-08-30 ENCOUNTER — Encounter: Payer: Self-pay | Admitting: Family Medicine

## 2022-08-30 VITALS — BP 130/82 | HR 96 | Temp 98.2°F | Resp 16 | Ht 74.0 in | Wt 225.5 lb

## 2022-08-30 DIAGNOSIS — Z8709 Personal history of other diseases of the respiratory system: Secondary | ICD-10-CM

## 2022-08-30 DIAGNOSIS — Z23 Encounter for immunization: Secondary | ICD-10-CM

## 2022-08-30 DIAGNOSIS — Z789 Other specified health status: Secondary | ICD-10-CM

## 2022-08-30 DIAGNOSIS — Z8701 Personal history of pneumonia (recurrent): Secondary | ICD-10-CM

## 2022-08-30 DIAGNOSIS — Z720 Tobacco use: Secondary | ICD-10-CM | POA: Insufficient documentation

## 2022-08-30 DIAGNOSIS — K900 Celiac disease: Secondary | ICD-10-CM

## 2022-08-30 DIAGNOSIS — F411 Generalized anxiety disorder: Secondary | ICD-10-CM

## 2022-08-30 DIAGNOSIS — R030 Elevated blood-pressure reading, without diagnosis of hypertension: Secondary | ICD-10-CM

## 2022-08-30 DIAGNOSIS — Z5181 Encounter for therapeutic drug level monitoring: Secondary | ICD-10-CM

## 2022-08-30 MED ORDER — BUSPIRONE HCL 10 MG PO TABS: 5.0000 mg | ORAL_TABLET | Freq: Two times a day (BID) | ORAL | 3 refills | Status: AC | PRN

## 2022-08-30 MED ORDER — BUDESONIDE-FORMOTEROL FUMARATE 80-4.5 MCG/ACT IN AERO: 2.0000 | INHALATION_SPRAY | Freq: Four times a day (QID) | RESPIRATORY_TRACT | 3 refills | Status: DC | PRN

## 2022-08-30 NOTE — Assessment & Plan Note (Signed)
Stable, managed with diet, no diarrhea, no hx of deficiencies

## 2022-08-30 NOTE — Progress Notes (Signed)
Name: Gavin Johnson   MRN: [id number]    DOB: 08-17-1996   Date:08/30/2022       Progress Note  Chief Complaint  Patient presents with   Follow-up   Anxiety     Subjective:   Gavin Johnson is a 26 y.o. male, presents to clinic for med refills  Anxiety, on buspar prn, but ran out of meds, it is still needed and helpful w/o se, using prn 10 mg usually in the am     08/30/2022    3:09 PM 05/27/2021    3:58 PM 11/25/2020   10:04 AM 02/21/2019    9:59 AM  GAD 7 : Generalized Anxiety Score  Nervous, Anxious, on Edge 2 1 1 1   Control/stop worrying 2 0 1 1  Worry too much - different things 1 1 1  0  Trouble relaxing 0 1 0 1  Restless 1 0 1 0  Easily annoyed or irritable 1 1 1 2   Afraid - awful might happen 0 0 1 0  Total GAD 7 Score 7 4 6 5   Anxiety Difficulty Somewhat difficult Not difficult at all Not difficult at all Not difficult at all      08/30/2022    3:08 PM 05/27/2021    3:09 PM 11/25/2020   10:03 AM  Depression screen PHQ 2/9  Decreased Interest 0 0 0  Down, Depressed, Hopeless 0 0 0  PHQ - 2 Score 0 0 0  Altered sleeping 1 0 2  Tired, decreased energy 1 0 0  Change in appetite 1 0 0  Feeling bad or failure about yourself  0 0 0  Trouble concentrating 0 0 1  Moving slowly or fidgety/restless 0 0 0  Suicidal thoughts 0 0 0  PHQ-9 Score 3 0 3  Difficult doing work/chores Not difficult at all Not difficult at all Not difficult at all    Recent E-visit with URI/rhinosinusitis sx - note on appt says need for allergy medicine, pt not currently rx any, but recent visit reviewed all URI sx  Hx of celiacs - controlled with diet, no diarrhea  Concern about elevated BP monitoring at home after dentist told him BP was high, 130-150, but also anxious about it too when check BP now at home.  He drinks heavily on weekends and does nicotine losange after told to stop dipping by dentist -  he wonders if he has some white coat htn BP Readings from Last 3 Encounters:   08/30/22 130/82  05/27/21 118/72  12/03/20 (!) 130/94   Pt denies CP, SOB, exertional sx, LE edema, palpitation, Ha's, visual disturbances, lightheadedness, hypotension, syncope. Dietary efforts for BP?  none       Current Outpatient Medications:    benzonatate (TESSALON) 100 MG capsule, Take 1 capsule (100 mg total) by mouth 3 (three) times daily as needed., Disp: 30 capsule, Rfl: 0   budesonide-formoterol (SYMBICORT) 80-4.5 MCG/ACT inhaler, Inhale 2 puffs into the lungs 4 (four) times daily as needed., Disp: 1 each, Rfl: 3   busPIRone (BUSPAR) 10 MG tablet, Take 0.5-1 tablets (5-10 mg total) by mouth 2 (two) times daily as needed., Disp: 180 tablet, Rfl: 3   ipratropium (ATROVENT) 0.03 % nasal spray, Place 2 sprays into both nostrils every 12 (twelve) hours., Disp: 30 mL, Rfl: 0   promethazine-dextromethorphan (PROMETHAZINE-DM) 6.25-15 MG/5ML syrup, Take 5 mLs by mouth 4 (four) times daily as needed., Disp: 118 mL, Rfl: 0  Patient Active Problem List   Diagnosis  Date Noted   Palpitations 11/25/2020   GAD (generalized anxiety disorder) 03/11/2020   Dilated aortic root ([place]) 01/17/2018   Family hx of aortic aneurysm 07/01/2015   Celiac disease     Past Surgical History:  Procedure Laterality Date   TONSILLECTOMY      Family History  Problem Relation Age of Onset   Arthritis Mother    Aortic aneurysm Father    Cancer Maternal Grandfather        colon   Diabetes Neg Hx     Social History   Tobacco Use   Smoking status: Never   Smokeless tobacco: Current    Types: Chew  Substance Use Topics   Alcohol use: Yes    Comment: 2-3x weekly   Drug use: No     Allergies  Allergen Reactions   Amoxil [Amoxicillin] Rash    Health Maintenance  Topic Date Due   TETANUS/TDAP  02/19/2026   Hepatitis C Screening  Completed   HIV Screening  Completed   HPV VACCINES  Aged Out   INFLUENZA VACCINE  Discontinued   COVID-19 Vaccine  Discontinued    Chart Review Today: I  personally reviewed active problem list, medication list, allergies, family history, social history, health maintenance, notes from last encounter, lab results, imaging with the patient/caregiver today.   Review of Systems  Constitutional: Negative.   HENT: Negative.    Eyes: Negative.   Respiratory: Negative.    Cardiovascular: Negative.   Gastrointestinal: Negative.   Endocrine: Negative.   Genitourinary: Negative.   Musculoskeletal: Negative.   Skin: Negative.   Allergic/Immunologic: Negative.   Neurological: Negative.   Hematological: Negative.   Psychiatric/Behavioral: Negative.    All other systems reviewed and are negative.    Objective:   Vitals:   08/30/22 1525  BP: 130/82  Pulse: 96  Resp: 16  Temp: 98.2 F (36.8 C)  TempSrc: Oral  SpO2: 98%  Weight: 225 lb 8 oz (102.3 kg)  Height: 6\' 2"  (1.88 m)    Body mass index is 28.95 kg/m.  Physical Exam Vitals and nursing note reviewed.  Constitutional:      General: He is not in acute distress.    Appearance: Normal appearance. He is well-developed, well-groomed and overweight. He is not ill-appearing, toxic-appearing or diaphoretic.  HENT:     Head: Normocephalic and atraumatic.     Nose: Nose normal.  Eyes:     General:        Right eye: No discharge.        Left eye: No discharge.     Conjunctiva/sclera: Conjunctivae normal.  Neck:     Trachea: No tracheal deviation.  Cardiovascular:     Rate and Rhythm: Normal rate and regular rhythm.     Pulses: Normal pulses.     Heart sounds: Normal heart sounds. No murmur heard.    No friction rub. No gallop.  Pulmonary:     Effort: Pulmonary effort is normal. No respiratory distress.     Breath sounds: Normal breath sounds. No stridor. No wheezing, rhonchi or rales.  Musculoskeletal:     Right lower leg: No edema.     Left lower leg: No edema.  Skin:    General: Skin is warm and dry.     Findings: No rash.  Neurological:     Mental Status: He is alert.      Motor: No abnormal muscle tone.     Coordination: Coordination normal.  Psychiatric:  Mood and Affect: Mood normal.        Behavior: Behavior normal. Behavior is cooperative.         Assessment & Plan:   Problem List Items Addressed This Visit       Digestive   Celiac disease    Stable, managed with diet, no diarrhea, no hx of deficiencies        Other   GAD (generalized anxiety disorder) - Primary    Mood and anxiety sx well controlled with buspar 5-10 mg prn, was taking daily for about 6 months, then able to stop taking for 6 months, recently needed more often again and here for med refill.  Phq-9 and gad -7 reviewed    08/30/2022    3:08 PM 05/27/2021    3:09 PM 11/25/2020   10:03 AM  Depression screen PHQ 2/9  Decreased Interest 0 0 0  Down, Depressed, Hopeless 0 0 0  PHQ - 2 Score 0 0 0  Altered sleeping 1 0 2  Tired, decreased energy 1 0 0  Change in appetite 1 0 0  Feeling bad or failure about yourself  0 0 0  Trouble concentrating 0 0 1  Moving slowly or fidgety/restless 0 0 0  Suicidal thoughts 0 0 0  PHQ-9 Score 3 0 3  Difficult doing work/chores Not difficult at all Not difficult at all Not difficult at all      08/30/2022    3:09 PM 05/27/2021    3:58 PM 11/25/2020   10:04 AM 02/21/2019    9:59 AM  GAD 7 : Generalized Anxiety Score  Nervous, Anxious, on Edge 2 1 1 1   Control/stop worrying 2 0 1 1  Worry too much - different things 1 1 1  0  Trouble relaxing 0 1 0 1  Restless 1 0 1 0  Easily annoyed or irritable 1 1 1 2   Afraid - awful might happen 0 0 1 0  Total GAD 7 Score 7 4 6 5   Anxiety Difficulty Somewhat difficult Not difficult at all Not difficult at all Not difficult at all  GAD 7 minimally elevated He uses nicotine and heavy ETOH drinking which likely make anxiety worse even though probably used as self medication. Refill on buspar sent in.  Encouraged to get off nicotine, reduce ETOH amount so more moderate use        Relevant  Medications   busPIRone (BUSPAR) 10 MG tablet   Other Visit Diagnoses     History of asthma       pt endorses asthma hx, recent URI that believes was pneumonia, can try symbicort maintenence when ill or prn rescue when symptomatic   Relevant Medications   budesonide-formoterol (SYMBICORT) 80-4.5 MCG/ACT inhaler   History of pneumonia       recnet URI illness with only e-visit, he reports improving, lungs CTA today, encouraged him to use inhaler and OTC supportive prn f/up in office when ill   Relevant Medications   budesonide-formoterol (SYMBICORT) 80-4.5 MCG/ACT inhaler   Elevated BP without diagnosis of hypertension       minimally elevated, reviewed goal bp, diet/lifestyle efforts, avoid nicotine, stimulants/caffeine and heavy etoh which will all increase BP/HR   Need for influenza vaccination       refused   Drinking binge       etoh health SE/risks reviewed encouraged decreasing amount   Nicotine use       encouraged decreasing use, reviewed effect on BP/body and health implications,  likely increasing BP and anxiety sx        Plan for pt to monitor BP occassionally, diet and lifestyle handout given on AVS and BP log to f/up if BP on average is >130/80 He can improve Bp on his own with cutting back on ETOH, nicotine, healthier diet, low salt diet, increasing exercise or walking or sometimes losing a few lbs. Encouraged him to do CPE soon and return sooner if BP is high  Return for 3-6 months for CPE (come sooner if BP is high).   Delsa Grana, PA-C 08/30/22 3:56 PM

## 2022-08-30 NOTE — Assessment & Plan Note (Signed)
Mood and anxiety sx well controlled with buspar 5-10 mg prn, was taking daily for about 6 months, then able to stop taking for 6 months, recently needed more often again and here for med refill.  Phq-9 and gad -7 reviewed    08/30/2022    3:08 PM 05/27/2021    3:09 PM 11/25/2020   10:03 AM  Depression screen PHQ 2/9  Decreased Interest 0 0 0  Down, Depressed, Hopeless 0 0 0  PHQ - 2 Score 0 0 0  Altered sleeping 1 0 2  Tired, decreased energy 1 0 0  Change in appetite 1 0 0  Feeling bad or failure about yourself  0 0 0  Trouble concentrating 0 0 1  Moving slowly or fidgety/restless 0 0 0  Suicidal thoughts 0 0 0  PHQ-9 Score 3 0 3  Difficult doing work/chores Not difficult at all Not difficult at all Not difficult at all      08/30/2022    3:09 PM 05/27/2021    3:58 PM 11/25/2020   10:04 AM 02/21/2019    9:59 AM  GAD 7 : Generalized Anxiety Score  Nervous, Anxious, on Edge 2 1 1 1   Control/stop worrying 2 0 1 1  Worry too much - different things 1 1 1  0  Trouble relaxing 0 1 0 1  Restless 1 0 1 0  Easily annoyed or irritable 1 1 1 2   Afraid - awful might happen 0 0 1 0  Total GAD 7 Score 7 4 6 5   Anxiety Difficulty Somewhat difficult Not difficult at all Not difficult at all Not difficult at all  GAD 7 minimally elevated He uses nicotine and heavy ETOH drinking which likely make anxiety worse even though probably used as self medication. Refill on buspar sent in.  Encouraged to get off nicotine, reduce ETOH amount so more moderate use

## 2022-08-30 NOTE — Patient Instructions (Addendum)
How to Take Your Blood Pressure Blood pressure measures how strongly your blood is pressing against the walls of your arteries. Arteries are blood vessels that carry blood from your heart throughout your body. You can take your blood pressure at home with a machine. You may need to check your blood pressure at home: To check if you have high blood pressure (hypertension). To check your blood pressure over time. To make sure your blood pressure medicine is working. Supplies needed: Blood pressure machine, or monitor. A chair to sit in. This should be a chair where you can sit upright with your back supported. Do not sit on a soft couch or an armchair. Table or desk. Small notebook. Pencil or pen. How to prepare Avoid these things for 30 minutes before checking your blood pressure: Having drinks with caffeine in them, such as coffee or tea. Drinking alcohol. Eating. Smoking. Exercising. Do these things five minutes before checking your blood pressure: Go to the bathroom and pee (urinate). Sit in a chair. Be quiet. Do not talk. How to take your blood pressure Follow the instructions that came with your machine. If you have a digital blood pressure monitor, these may be the instructions: Sit up straight. Place your feet on the floor. Do not cross your ankles or legs. Rest your left arm at the level of your heart. You may rest it on a table, desk, or chair. Pull up your shirt sleeve. Wrap the blood pressure cuff around the upper part of your left arm. The cuff should be 1 inch (2.5 cm) above your elbow. It is best to wrap the cuff around bare skin. Fit the cuff snugly around your arm, but not too tightly. You should be able to place only one finger between the cuff and your arm. Place the cord so that it rests in the bend of your elbow. Press the power button. Sit quietly while the cuff fills with air and loses air. Write down the numbers on the screen. Wait 2-3 minutes and then repeat  steps 1-10. What do the numbers mean? Two numbers make up your blood pressure. The first number is called systolic pressure. The second is called diastolic pressure. An example of a blood pressure reading is "120 over 80" (or 120/80). If you are an adult and do not have a medical condition, use this guide to find out if your blood pressure is normal: Normal First number: below 120. Second number: below 80. Elevated First number: 120-129. Second number: below 80. Hypertension stage 1 First number: 130-139. Second number: 80-89. Hypertension stage 2 First number: 140 or above. Second number: 90 or above. Your blood pressure is above normal even if only the first or only the second number is above normal. Follow these instructions at home: Medicines Take over-the-counter and prescription medicines only as told by your doctor. Tell your doctor if your medicine is causing side effects. General instructions Check your blood pressure as often as your doctor tells you to. Check your blood pressure at the same time every day. Take your monitor to your next doctor's appointment. Your doctor will: Make sure you are using it correctly. Make sure it is working right. Understand what your blood pressure numbers should be. Keep all follow-up visits. General tips You will need a blood pressure machine or monitor. Your doctor can suggest a monitor. You can buy one at a drugstore or online. When choosing one: Choose one with an arm cuff. Choose one that wraps around your   upper arm. Only one finger should fit between your arm and the cuff. Do not choose one that measures your blood pressure from your wrist or finger. Where to find more information American Heart Association: www.heart.org Contact a doctor if: Your blood pressure keeps being high. Your blood pressure is suddenly low. Get help right away if: Your first blood pressure number is higher than 180. Your second blood pressure number is  higher than 120. These symptoms may be an emergency. Do not wait to see if the symptoms will go away. Get help right away. Call 911. Summary Check your blood pressure at the same time every day. Avoid caffeine, alcohol, smoking, and exercise for 30 minutes before checking your blood pressure. Make sure you understand what your blood pressure numbers should be. This information is not intended to replace advice given to you by your health care provider. Make sure you discuss any questions you have with your health care provider. Document Revised: 06/24/2021 Document Reviewed: 06/24/2021 Elsevier Patient Education  2023 Elsevier Inc.    DASH Eating Plan DASH stands for Dietary Approaches to Stop Hypertension. The DASH eating plan is a healthy eating plan that has been shown to: Reduce high blood pressure (hypertension). Reduce your risk for type 2 diabetes, heart disease, and stroke. Help with weight loss. What are tips for following this plan? Reading food labels Check food labels for the amount of salt (sodium) per serving. Choose foods with less than 5 percent of the Daily Value of sodium. Generally, foods with less than 300 milligrams (mg) of sodium per serving fit into this eating plan. To find whole grains, look for the word "whole" as the first word in the ingredient list. Shopping Buy products labeled as "low-sodium" or "no salt added." Buy fresh foods. Avoid canned foods and pre-made or frozen meals. Cooking Avoid adding salt when cooking. Use salt-free seasonings or herbs instead of table salt or sea salt. Check with your health care provider or pharmacist before using salt substitutes. Do not fry foods. Cook foods using healthy methods such as baking, boiling, grilling, roasting, and broiling instead. Cook with heart-healthy oils, such as olive, canola, avocado, soybean, or sunflower oil. Meal planning  Eat a balanced diet that includes: 4 or more servings of fruits and 4 or  more servings of vegetables each day. Try to fill one-half of your plate with fruits and vegetables. 6-8 servings of whole grains each day. Less than 6 oz (170 g) of lean meat, poultry, or fish each day. A 3-oz (85-g) serving of meat is about the same size as a deck of cards. One egg equals 1 oz (28 g). 2-3 servings of low-fat dairy each day. One serving is 1 cup (237 mL). 1 serving of nuts, seeds, or beans 5 times each week. 2-3 servings of heart-healthy fats. Healthy fats called omega-3 fatty acids are found in foods such as walnuts, flaxseeds, fortified milks, and eggs. These fats are also found in cold-water fish, such as sardines, salmon, and mackerel. Limit how much you eat of: Canned or prepackaged foods. Food that is high in trans fat, such as some fried foods. Food that is high in saturated fat, such as fatty meat. Desserts and other sweets, sugary drinks, and other foods with added sugar. Full-fat dairy products. Do not salt foods before eating. Do not eat more than 4 egg yolks a week. Try to eat at least 2 vegetarian meals a week. Eat more home-cooked food and less restaurant, buffet, and  fast food. Lifestyle When eating at a restaurant, ask that your food be prepared with less salt or no salt, if possible. If you drink alcohol: Limit how much you use to: 0-1 drink a day for women who are not pregnant. 0-2 drinks a day for men. Be aware of how much alcohol is in your drink. In the [place] one drink equals one 12 oz bottle of beer (355 mL), one 5 oz glass of wine (148 mL), or one 1 oz glass of hard liquor (44 mL). General information Avoid eating more than 2,300 mg of salt a day. If you have hypertension, you may need to reduce your sodium intake to 1,500 mg a day. Work with your health care provider to maintain a healthy body weight or to lose weight. Ask what an ideal weight is for you. Get at least 30 minutes of exercise that causes your heart to beat faster (aerobic exercise)  most days of the week. Activities may include walking, swimming, or biking. Work with your health care provider or dietitian to adjust your eating plan to your individual calorie needs. What foods should I eat? Fruits All fresh, dried, or frozen fruit. Canned fruit in natural juice (without added sugar). Vegetables Fresh or frozen vegetables (raw, steamed, roasted, or grilled). Low-sodium or reduced-sodium tomato and vegetable juice. Low-sodium or reduced-sodium tomato sauce and tomato paste. Low-sodium or reduced-sodium canned vegetables. Grains Whole-grain or whole-wheat bread. Whole-grain or whole-wheat pasta. Brown rice. Orpah Cobb. Bulgur. Whole-grain and low-sodium cereals. Pita bread. Low-fat, low-sodium crackers. Whole-wheat flour tortillas. Meats and other proteins Skinless chicken or [place]. Ground chicken or [place]. Pork with fat trimmed off. Fish and seafood. Egg whites. Dried beans, peas, or lentils. Unsalted nuts, nut butters, and seeds. Unsalted canned beans. Lean cuts of beef with fat trimmed off. Low-sodium, lean precooked or cured meat, such as sausages or meat loaves. Dairy Low-fat (1%) or fat-free (skim) milk. Reduced-fat, low-fat, or fat-free cheeses. Nonfat, low-sodium ricotta or cottage cheese. Low-fat or nonfat yogurt. Low-fat, low-sodium cheese. Fats and oils Soft margarine without trans fats. Vegetable oil. Reduced-fat, low-fat, or light mayonnaise and salad dressings (reduced-sodium). Canola, safflower, olive, avocado, soybean, and sunflower oils. Avocado. Seasonings and condiments Herbs. Spices. Seasoning mixes without salt. Other foods Unsalted popcorn and pretzels. Fat-free sweets. The items listed above may not be a complete list of foods and beverages you can eat. Contact a dietitian for more information. What foods should I avoid? Fruits Canned fruit in a light or heavy syrup. Fried fruit. Fruit in cream or butter sauce. Vegetables Creamed or fried  vegetables. Vegetables in a cheese sauce. Regular canned vegetables (not low-sodium or reduced-sodium). Regular canned tomato sauce and paste (not low-sodium or reduced-sodium). Regular tomato and vegetable juice (not low-sodium or reduced-sodium). Rosita Fire. Olives. Grains Baked goods made with fat, such as croissants, muffins, or some breads. Dry pasta or rice meal packs. Meats and other proteins Fatty cuts of meat. Ribs. Fried meat. Tomasa Blase. Bologna, salami, and other precooked or cured meats, such as sausages or meat loaves. Fat from the back of a pig (fatback). Bratwurst. Salted nuts and seeds. Canned beans with added salt. Canned or smoked fish. Whole eggs or egg yolks. Chicken or [place] with skin. Dairy Whole or 2% milk, cream, and half-and-half. Whole or full-fat cream cheese. Whole-fat or sweetened yogurt. Full-fat cheese. Nondairy creamers. Whipped toppings. Processed cheese and cheese spreads. Fats and oils Butter. Stick margarine. Lard. Shortening. Ghee. Bacon fat. Tropical oils, such as coconut, palm kernel, or  palm oil. Seasonings and condiments Onion salt, garlic salt, seasoned salt, table salt, and sea salt. Worcestershire sauce. Tartar sauce. Barbecue sauce. Teriyaki sauce. Soy sauce, including reduced-sodium. Steak sauce. Canned and packaged gravies. Fish sauce. Oyster sauce. Cocktail sauce. Store-bought horseradish. Ketchup. Mustard. Meat flavorings and tenderizers. Bouillon cubes. Hot sauces. Pre-made or packaged marinades. Pre-made or packaged taco seasonings. Relishes. Regular salad dressings. Other foods Salted popcorn and pretzels. The items listed above may not be a complete list of foods and beverages you should avoid. Contact a dietitian for more information. Where to find more information National Heart, Lung, and Blood Institute: https://wilson-eaton.com/ American Heart Association: www.heart.org Academy of Nutrition and Dietetics: www.eatright.[place]:  www.kidney.org Summary The DASH eating plan is a healthy eating plan that has been shown to reduce high blood pressure (hypertension). It may also reduce your risk for type 2 diabetes, heart disease, and stroke. When on the DASH eating plan, aim to eat more fresh fruits and vegetables, whole grains, lean proteins, low-fat dairy, and heart-healthy fats. With the DASH eating plan, you should limit salt (sodium) intake to 2,300 mg a day. If you have hypertension, you may need to reduce your sodium intake to 1,500 mg a day. Work with your health care provider or dietitian to adjust your eating plan to your individual calorie needs. This information is not intended to replace advice given to you by your health care provider. Make sure you discuss any questions you have with your health care provider. Document Revised: 09/13/2019 Document Reviewed: 09/13/2019 Elsevier Patient Education  [place].   Blood Pressure Record Sheet  Blood pressure log Date: _______________________ a.m. _____________________(1st reading) _____________________(2nd reading) p.m. _____________________(1st reading) _____________________(2nd reading) Date: _______________________ a.m. _____________________(1st reading) _____________________(2nd reading) p.m. _____________________(1st reading) _____________________(2nd reading) Date: _______________________ a.m. _____________________(1st reading) _____________________(2nd reading) p.m. _____________________(1st reading) _____________________(2nd reading) Date: _______________________ a.m. _____________________(1st reading) _____________________(2nd reading) p.m. _____________________(1st reading) _____________________(2nd reading) Date: _______________________ a.m. _____________________(1st reading) _____________________(2nd reading) p.m. _____________________(1st reading) _____________________(2nd reading) Date: _______________________ a.m.  _____________________(1st reading) _____________________(2nd reading) p.m. _____________________(1st reading) _____________________(2nd reading) Date: _______________________ a.m. _____________________(1st reading) _____________________(2nd reading) p.m. _____________________(1st reading) _____________________(2nd reading) Date: _______________________ a.m. _____________________(1st reading) _____________________(2nd reading) p.m. _____________________(1st reading) _____________________(2nd reading) Date: _______________________ a.m. _____________________(1st reading) _____________________(2nd reading) p.m. _____________________(1st reading) _____________________(2nd reading) Date: _______________________ a.m. _____________________(1st reading) _____________________(2nd reading) p.m. _____________________(1st reading) _____________________(2nd reading)

## 2022-08-31 ENCOUNTER — Telehealth: Payer: Self-pay | Admitting: Family Medicine

## 2022-08-31 NOTE — Telephone Encounter (Signed)
Caller seeking clarity regarding budesonide-formoterol (SYMBICORT) 80-4.5 MCG/ACT inhaler directions reflecting Inhale 2 puffs into the lungs 4 (four) times daily as needed. - Inhalation, caller states insurance is flagging the direction.    Caller states the direction is normally 2 puffs twice a day.Pharmacy would like a follow up call.   [facility] - [place], [place] - [address] Phone: [phone]  Fax: [phone]

## 2022-08-31 NOTE — Telephone Encounter (Signed)
Please let the pharmacy know that the current new guidelines for asthma management includes using symbicort as rescue or daily 2 puffs BID + as rescue - so sig was written to include that flexibility when needed when ill

## 2022-09-01 ENCOUNTER — Telehealth: Payer: Self-pay | Admitting: Family Medicine

## 2022-09-01 MED ORDER — SYMBICORT 160-4.5 MCG/ACT IN AERO: INHALATION_SPRAY | RESPIRATORY_TRACT | 12 refills | Status: DC

## 2022-09-01 NOTE — Telephone Encounter (Signed)
Since previous prescription was a lower dose, will this one be maximum 2 puffs daily as needed?

## 2022-09-01 NOTE — Telephone Encounter (Signed)
Gavin Johnson with [facility] called in stating they do not have the specific strength available on the Symbicort the provider ordered but that they have the 160 strength. She says the insurance prefers brand name and just wants to make sure this is ok. Please assist further

## 2022-09-01 NOTE — Telephone Encounter (Signed)
Spoke to [place] stated to her rx meant 4 times daily PRN.

## 2022-09-01 NOTE — Addendum Note (Signed)
Addended by: Danelle Berry on: 09/01/2022 12:01 PM   Modules accepted: Orders

## 2022-09-01 NOTE — Telephone Encounter (Signed)
Copied from CRM [id number]. Topic: General - Other >> Sep 01, 2022 12:33 PM Macon Large wrote: Reason for CRM: Revonda Standard with [facility] requested to speak with Maricela. Cb# [phone]

## 2022-09-01 NOTE — Telephone Encounter (Signed)
Amy from [facility] states received r for Symbacort said 80 at 2 puffs 4 times a day , but then received another says 160 2 puffs /4 times a day   and she wants to make sure if its 2 puffs on 4,puffs at 160, because she is thinking that's too much and   it should be 1 puff at 160. Please call back to clarify in detail.

## 2022-09-01 NOTE — Telephone Encounter (Signed)
Spoke with PCP and PCP stated its ok 4 times PRN. Called Amy but spoke to [place] and verbalized per PCP its is ok to prescribe him that. Directions 2 puffs and can do up to 4 puffs PRN.

## 2022-09-01 NOTE — Telephone Encounter (Signed)
Spoke to [place] relayed PCP message

## 2022-09-01 NOTE — Telephone Encounter (Signed)
Spoke to [place]- she wants the new prescription sent. Please advice

## 2022-11-24 ENCOUNTER — Ambulatory Visit: Payer: BC Managed Care – PPO | Admitting: Cardiovascular Disease

## 2022-12-26 DIAGNOSIS — H52223 Regular astigmatism, bilateral: Secondary | ICD-10-CM | POA: Diagnosis not present

## 2023-06-08 ENCOUNTER — Other Ambulatory Visit: Payer: Self-pay | Admitting: Family Medicine

## 2023-06-08 DIAGNOSIS — F411 Generalized anxiety disorder: Secondary | ICD-10-CM

## 2023-06-08 NOTE — Telephone Encounter (Signed)
Pt needs appt

## 2023-08-31 NOTE — Progress Notes (Signed)
Cardiology Clinic Note   Date: 09/01/2023 ID: Gavin Johnson, DOB 1996/05/01, MRN [id number]  Primary Cardiologist:  Lorine Bears, MD  Patient Profile    Gavin Johnson is a 27 y.o. male who presents to the clinic today for overdue routine follow up.     Past medical history significant for: Dilatation of aorta. Echo 01/17/2018: EF 60 to 65%.  No RWMA.  Normal diastolic parameters.  Mildly dilated ascending aorta 39 mm.  Normal aortic root 38 mm, aortic arch 31 mm.  Significant change from November 2016. CTA aorta 12/17/2020: Ascending aorta 3.5 cm, aortic arch 2.7 cm, descending aorta 2.6 cm.  No thoracic aortic aneurysm, dissection, or other acute findings. Palpitations. 14-day ZIO 02/06/2018: Normal sinus rhythm.  6 beat run of V. tach.  1 short run of SVT.  Rare PACs/PVCs. Celiac disease. GAD. Tobacco abuse.  In summary, was first evaluated by cardiology on 01/17/2018 for shortness of breath, palpitations, dizziness at the request of Dr. Sherie Don.  Prior echo November 2016 showed EF 60 to 65%, no RWMA, mildly dilated aortic root 38 mm, mild to moderately dilated ascending aorta 40 mm.  Patient's father with history of aortic aneurysm status post surgical repair at age 23.  Grandfather also with aortic aneurysm that was not intervened upon.  Patient reported a 55-month history of DOE and dizziness when performing strenuous activities.  Symptoms improve with resting 3 to 5 minutes.  Patient also reported palpitations described as heart pounding when resting in the evenings.  Repeat echo showed stable aortic root/ascending aorta measurements (details above).  14-day ZIO as detailed above.     History of Present Illness    Gavin Johnson is followed by Dr. Kirke Corin for the above outlined history.  Patient was last seen in the office by Dr. Kirke Corin on 12/03/2020.  He reported palpitations described as skipped beats.  EKG was normal.  There was concern regarding patient's job which required  heavy lifting.  CTA aorta performed and showed aortic measurements upper limits of normal.  Recommendation made to recheck CTA in a few years.  Discussed the use of AI scribe software for clinical note transcription with the patient, who gave verbal consent to proceed.  The patient, with a history of aortic dilation, presents with occasional chest pain that they describe as a 'sharp pain' that occurs about an hour after eating. He denies any pattern related to specific foods. The pain is located in the left upper quadrant of his abdomen and is not associated with shortness of breath. The pain is relieved by Gaviscon. He has never had the discomfort with exertion at his job or on his daily hike with his dog. Patient denies shortness of breath, dyspnea on exertion, lower extremity edema, orthopnea or PND. He reports palpitations have decreased in frequency. He still has them at night and attributes them to anxiety.         ROS: All other systems reviewed and are otherwise negative except as noted in History of Present Illness.  Studies Reviewed    EKG Interpretation Date/Time:  Friday September 01 2023 15:03:09 EST Ventricular Rate:  77 PR Interval:  146 QRS Duration:  94 QT Interval:  366 QTC Calculation: 414 R Axis:   -1  Text Interpretation: Normal sinus rhythm Previous EKG 12/03/2020 no significant change Confirmed by Carlos Levering [phone]) on 09/01/2023 3:08:54 PM        Physical Exam    VS:  BP 126/80 (BP Location: Left Arm,  Patient Position: Sitting, Cuff Size: Normal)   Pulse 77   Ht 6\' 2"  (1.88 m)   Wt 221 lb (100.2 kg)   SpO2 99%   BMI 28.37 kg/m  , BMI Body mass index is 28.37 kg/m.  GEN: Well nourished, well developed, in no acute distress. Neck: No JVD or carotid bruits. Cardiac:  RRR. No murmurs. No rubs or gallops.   Respiratory:  Respirations regular and unlabored. Clear to auscultation without rales, wheezing or rhonchi. GI: Soft, nontender,  nondistended. Extremities: Radials/DP/PT 2+ and equal bilaterally. No clubbing or cyanosis. No edema.  Skin: Warm and dry, no rash. Neuro: Strength intact.  Assessment & Plan      Dilatation of aorta Found on echo November 2016 with repeat echo March 2019 showing stable measurements.  CTA aorta February 2022 showed aortic measurements upper limits of normal.  Patient denies back pain. He reports occasional chest pain that is more located in upper left quadrant of abdomen and occurs about 1 hour after eating. It is relieved with Gaviscon. He has never had discomfort with exertional activities at work or during his daily hike with his dog.  -Will get repeat CTA aorta for surveillance of aortic dilatation.   Palpitations 14-day ZIO April 2019 showed normal sinus rhythm with 6 runs of NSVT, 1 short run of SVT, rare PVCs/PACs.  Patient report palpitations are less frequent than previously. Still occur mainly at night. He attributes it to anxiety.  -Beta blocker does not appear to be indicated at this time.      Disposition: CTA aorta. Return in 1 year or sooner as needed.          Signed, Etta Grandchild. Larose Batres, DNP, NP-C

## 2023-09-01 ENCOUNTER — Encounter: Payer: Self-pay | Admitting: Student

## 2023-09-01 ENCOUNTER — Ambulatory Visit: Payer: BC Managed Care – PPO | Attending: Student | Admitting: Student

## 2023-09-01 VITALS — BP 126/80 | HR 77 | Ht 74.0 in | Wt 221.0 lb

## 2023-09-01 DIAGNOSIS — Z8249 Family history of ischemic heart disease and other diseases of the circulatory system: Secondary | ICD-10-CM | POA: Diagnosis not present

## 2023-09-01 DIAGNOSIS — R002 Palpitations: Secondary | ICD-10-CM | POA: Diagnosis not present

## 2023-09-01 DIAGNOSIS — I77810 Thoracic aortic ectasia: Secondary | ICD-10-CM | POA: Diagnosis not present

## 2023-09-01 NOTE — Patient Instructions (Signed)
Medication Instructions:  Your physician recommends that you continue on your current medications as directed. Please refer to the Current Medication list given to you today.  *If you need a refill on your cardiac medications before your next appointment, please call your pharmacy*  Lab Work: - None ordered  Testing/Procedures: CT Angiography (CTA) chest/aorta, is a special type of CT scan that uses a computer to produce multi-dimensional views of major blood vessels throughout the body. In CT angiography, a contrast material is injected through an IV to help visualize the blood vessels  Nothing to eat or drink 4 hours prior to test  [facility] [address]  [place], [place] [address]   Follow-Up: At [facility], you and your health needs are our priority.  As part of our continuing mission to provide you with exceptional heart care, we have created designated Provider Care Teams.  These Care Teams include your primary Cardiologist (physician) and Advanced Practice Providers (APPs -  Physician Assistants and Nurse Practitioners) who all work together to provide you with the care you need, when you need it.  Your next appointment:   1 year(s)  Provider:   Carlos Levering, NP    Other Instructions - None

## 2023-09-14 ENCOUNTER — Other Ambulatory Visit: Payer: Self-pay | Admitting: Family Medicine

## 2023-09-14 DIAGNOSIS — F411 Generalized anxiety disorder: Secondary | ICD-10-CM
